# Patient Record
Sex: Female | Born: 1995 | Race: Black or African American | Hispanic: No | Marital: Single | Smoking: Never smoker
Health system: Southern US, Community
[De-identification: ages and names within clinical notes are randomized; demographics above are authoritative.]

## PROBLEM LIST (undated history)

## (undated) ENCOUNTER — Ambulatory Visit: Admission: EM | Source: Home / Self Care

## (undated) ENCOUNTER — Inpatient Hospital Stay (HOSPITAL_COMMUNITY): Payer: Self-pay

## (undated) DIAGNOSIS — D649 Anemia, unspecified: Secondary | ICD-10-CM

## (undated) DIAGNOSIS — R011 Cardiac murmur, unspecified: Secondary | ICD-10-CM

## (undated) HISTORY — PX: NO PAST SURGERIES: SHX2092

---

## 2018-05-21 ENCOUNTER — Ambulatory Visit: Payer: Self-pay | Admitting: Nurse Practitioner

## 2018-05-21 ENCOUNTER — Encounter: Payer: Self-pay | Admitting: Nurse Practitioner

## 2018-05-21 VITALS — BP 112/60 | HR 74 | Temp 98.9°F | Wt 135.8 lb

## 2018-05-21 DIAGNOSIS — H6691 Otitis media, unspecified, right ear: Secondary | ICD-10-CM

## 2018-05-21 MED ORDER — AMOXICILLIN 875 MG PO TABS: 875.0000 mg | ORAL_TABLET | Freq: Two times a day (BID) | ORAL | 0 refills | Status: AC

## 2018-05-21 NOTE — Patient Instructions (Signed)
Otitis Media, Adult Continue use of Tylenol as needed for pain. -Warm compresses to the right ear for comfort. -Avoid getting water in the right ear. -Follow up with ER or ENT if loss of hearing, change in hearing or symptoms do not improve.   Otitis media occurs when there is inflammation and fluid in the middle ear. Your middle ear is a part of the ear that contains bones for hearing as well as air that helps send sounds to your brain. What are the causes? This condition is caused by a blockage in the eustachian tube. This tube drains fluid from the ear to the back of the nose (nasopharynx). A blockage in this tube can be caused by an object or by swelling (edema) in the tube. Problems that can cause a blockage include:  A cold or other upper respiratory infection.  Allergies.  An irritant, such as tobacco smoke.  Enlarged adenoids. The adenoids are areas of soft tissue located high in the back of the throat, behind the nose and the roof of the mouth.  A mass in the nasopharynx.  Damage to the ear caused by pressure changes (barotrauma).  What are the signs or symptoms? Symptoms of this condition include:  Ear pain.  A fever.  Decreased hearing.  A headache.  Tiredness (lethargy).  Fluid leaking from the ear.  Ringing in the ear.  How is this diagnosed? This condition is diagnosed with a physical exam. During the exam your health care provider will use an instrument called an otoscope to look into your ear and check for redness, swelling, and fluid. He or she will also ask about your symptoms. Your health care provider may also order tests, such as:  A test to check the movement of the eardrum (pneumatic otoscopy). This test is done by squeezing a small amount of air into the ear.  A test that changes air pressure in the middle ear to check how well the eardrum moves and whether the eustachian tube is working (tympanogram).  How is this treated? This condition  usually goes away on its own within 3-5 days. But if the condition is caused by a bacteria infection and does not go away own its own, or keeps coming back, your health care provider may:  Prescribe antibiotic medicines to treat the infection.  Prescribe or recommend medicines to control pain.  Follow these instructions at home:  Take over-the-counter and prescription medicines only as told by your health care provider.  If you were prescribed an antibiotic medicine, take it as told by your health care provider. Do not stop taking the antibiotic even if you start to feel better.  Keep all follow-up visits as told by your health care provider. This is important. Contact a health care provider if:  You have bleeding from your nose.  There is a lump on your neck.  You are not getting better in 5 days.  You feel worse instead of better. Get help right away if:  You have severe pain that is not controlled with medicine.  You have swelling, redness, or pain around your ear.  You have stiffness in your neck.  A part of your face is paralyzed.  The bone behind your ear (mastoid) is tender when you touch it.  You develop a severe headache. Summary  Otitis media is redness, soreness, and swelling of the middle ear.  This condition usually goes away on its own within 3-5 days.  If the problem does not go  away in 3-5 days, your health care provider may prescribe or recommend medicines to treat your symptoms.  If you were prescribed an antibiotic medicine, take it as told by your health care provider. This information is not intended to replace advice given to you by your health care provider. Make sure you discuss any questions you have with your health care provider. Document Released: 07/24/2004 Document Revised: 10/09/2016 Document Reviewed: 10/09/2016 Elsevier Interactive Patient Education  [employer].

## 2018-05-21 NOTE — Progress Notes (Signed)
Subjective:    Patient ID: Lindsay Simmons, female    DOB: 1996/10/27, 22 y.o.   MRN: [id number]  Patient is a 22 year old female who presents today for complaints of right ear pain.  The patient states her pain started about 3 days ago after she used some ear cleaner to clean her ears.  The patient states while she was doing this she use regular water from the faucet instead of distilled water.  The patient states the next morning she woke up with ear pain.  Patient also states that she sometimes feels like her ears have had to "pop ".  The patient states that she does have a history of cerumen impaction, but feels that this is different today.  The patient denies loss of hearing, change in hearing, coughing, runny nose, congestion, sore throat, but states that she did have some chills and possibly a fever.  The patient states that she has taken Tylenol which has helped her ear pain.  The patient denies any history of seasonal allergies.  Otalgia   There is pain in the right ear. The current episode started in the past 7 days. The problem occurs every few hours. The problem has been unchanged. Maximum temperature: suspected but not measured. The fever has been present for 1 to 2 days. The pain is at a severity of 4/10. The pain is mild. Pertinent negatives include no coughing, ear discharge, headaches, hearing loss, rhinorrhea or sore throat. She has tried acetaminophen for the symptoms. The treatment provided mild relief. cerumen impaction    Review of Systems  Constitutional: Positive for activity change, chills, fatigue and fever. Negative for appetite change.  HENT: Positive for ear pain. Negative for congestion, ear discharge, hearing loss, postnasal drip, rhinorrhea, sinus pressure, sinus pain, sneezing, sore throat and tinnitus.   Eyes: Negative.   Respiratory: Negative.  Negative for cough.   Cardiovascular: Negative.   Skin: Negative.   Allergic/Immunologic: Positive for environmental  allergies.  Neurological: Negative for dizziness and headaches.       Objective:   Physical Exam  Constitutional: She appears well-developed and well-nourished. No distress.  HENT:  Head: Normocephalic and atraumatic.  Right TM erythematous and bulging, ear canal is erythematous, tragus and pinna are tender to manipulation. Left TM is opaque, able to visualize landmarks, canal without erythema.  Eyes: Pupils are equal, round, and reactive to light. Conjunctivae and EOM are normal.  Neck: Normal range of motion. Neck supple. No tracheal deviation present. No thyromegaly present.  Cardiovascular: Normal rate, regular rhythm and normal heart sounds.  Pulmonary/Chest: Effort normal and breath sounds normal.  Skin: Skin is warm and dry. Capillary refill takes 2 to 3 seconds.  Psychiatric: She has a normal mood and affect.       Assessment & Plan:  Exam findings, diagnosis etiology and medication use and indications reviewed with patient. Follow- Up and discharge instructions provided. No emergent/urgent issues found on exam.  Patient verbalized understanding of information provided and agrees with plan of care (POC), all questions answered.  1. Right otitis media, unspecified otitis media type  - amoxicillin (AMOXIL) 875 MG tablet; Take 1 tablet (875 mg total) by mouth 2 (two) times daily for 10 days.  Dispense: 20 tablet; Refill: 0 -Continue use of Tylenol as needed for pain. -Warm compresses to the right ear for comfort. -Avoid getting water in the right ear. -Follow up with ER or ENT if loss of hearing, change in hearing or symptoms do not improve.

## 2018-10-15 ENCOUNTER — Inpatient Hospital Stay (HOSPITAL_COMMUNITY)
Admission: AD | Admit: 2018-10-15 | Discharge: 2018-10-15 | Disposition: A | Discharge status: Home / Self Care | Payer: Medicaid Other | Source: Ambulatory Visit | Attending: Family Medicine | Admitting: Family Medicine

## 2018-10-15 ENCOUNTER — Other Ambulatory Visit: Payer: Self-pay

## 2018-10-15 ENCOUNTER — Encounter (HOSPITAL_COMMUNITY): Payer: Self-pay | Admitting: *Deleted

## 2018-10-15 DIAGNOSIS — Z3A12 12 weeks gestation of pregnancy: Secondary | ICD-10-CM | POA: Diagnosis not present

## 2018-10-15 DIAGNOSIS — Z3491 Encounter for supervision of normal pregnancy, unspecified, first trimester: Secondary | ICD-10-CM

## 2018-10-15 DIAGNOSIS — N939 Abnormal uterine and vaginal bleeding, unspecified: Secondary | ICD-10-CM | POA: Diagnosis not present

## 2018-10-15 DIAGNOSIS — O26891 Other specified pregnancy related conditions, first trimester: Secondary | ICD-10-CM | POA: Diagnosis not present

## 2018-10-15 DIAGNOSIS — O209 Hemorrhage in early pregnancy, unspecified: Secondary | ICD-10-CM

## 2018-10-15 DIAGNOSIS — N930 Postcoital and contact bleeding: Secondary | ICD-10-CM | POA: Insufficient documentation

## 2018-10-15 HISTORY — DX: Anemia, unspecified: D64.9

## 2018-10-15 LAB — URINALYSIS, MICROSCOPIC (REFLEX)

## 2018-10-15 LAB — CBC WITH DIFFERENTIAL/PLATELET
Basophils Absolute: 0 10*3/uL (ref 0.0–0.1)
Basophils Relative: 0 %
Eosinophils Absolute: 0 10*3/uL (ref 0.0–0.5)
Eosinophils Relative: 0 %
HEMATOCRIT: 32.8 % — AB (ref 36.0–46.0)
Hemoglobin: 11.1 g/dL — ABNORMAL LOW (ref 12.0–15.0)
Lymphocytes Relative: 28 %
Lymphs Abs: 3 10*3/uL (ref 0.7–4.0)
MCH: 25.5 pg — ABNORMAL LOW (ref 26.0–34.0)
MCHC: 33.8 g/dL (ref 30.0–36.0)
MCV: 75.2 fL — ABNORMAL LOW (ref 80.0–100.0)
MONO ABS: 0.4 10*3/uL (ref 0.1–1.0)
Monocytes Relative: 4 %
NEUTROS ABS: 7.2 10*3/uL (ref 1.7–7.7)
Neutrophils Relative %: 68 %
PLATELETS: 190 10*3/uL (ref 150–400)
RBC: 4.36 MIL/uL (ref 3.87–5.11)
RDW: 13.6 % (ref 11.5–15.5)
WBC: 10.6 10*3/uL — AB (ref 4.0–10.5)
nRBC: 0 % (ref 0.0–0.2)

## 2018-10-15 LAB — URINALYSIS, ROUTINE W REFLEX MICROSCOPIC
Bilirubin Urine: NEGATIVE
Glucose, UA: NEGATIVE mg/dL
Ketones, ur: NEGATIVE mg/dL
LEUKOCYTES UA: NEGATIVE
Nitrite: NEGATIVE
Protein, ur: NEGATIVE mg/dL
Specific Gravity, Urine: 1.015 (ref 1.005–1.030)
pH: 6 (ref 5.0–8.0)

## 2018-10-15 LAB — HCG, QUANTITATIVE, PREGNANCY: hCG, Beta Chain, Quant, S: 88783 m[IU]/mL — ABNORMAL HIGH (ref <5)

## 2018-10-15 LAB — WET PREP, GENITAL
Clue Cells Wet Prep HPF POC: NONE SEEN
Sperm: NONE SEEN
Trich, Wet Prep: NONE SEEN
Yeast Wet Prep HPF POC: NONE SEEN

## 2018-10-15 NOTE — Discharge Instructions (Signed)
Second Trimester of Pregnancy The second trimester is from week 13 through week 28, month 4 through 6. This is often the time in pregnancy that you feel your best. Often times, morning sickness has lessened or quit. You may have more energy, and you may get hungry more often. Your unborn baby (fetus) is growing rapidly. At the end of the sixth month, he or she is about 9 inches long and weighs about 1 pounds. You will likely feel the baby move (quickening) between 18 and 20 weeks of pregnancy. Follow these instructions at home:  Avoid all smoking, herbs, and alcohol. Avoid drugs not approved by your doctor.  Do not use any tobacco products, including cigarettes, chewing tobacco, and electronic cigarettes. If you need help quitting, ask your doctor. You may get counseling or other support to help you quit.  Only take medicine as told by your doctor. Some medicines are safe and some are not during pregnancy.  Exercise only as told by your doctor. Stop exercising if you start having cramps.  Eat regular, healthy meals.  Wear a good support bra if your breasts are tender.  Do not use hot tubs, steam rooms, or saunas.  Wear your seat belt when driving.  Avoid raw meat, uncooked cheese, and liter boxes and soil used by cats.  Take your prenatal vitamins.  Take 1500-2000 milligrams of calcium daily starting at the 20th week of pregnancy until you deliver your baby.  Try taking medicine that helps you poop (stool softener) as needed, and if your doctor approves. Eat more fiber by eating fresh fruit, vegetables, and whole grains. Drink enough fluids to keep your pee (urine) clear or pale yellow.  Take warm water baths (sitz baths) to soothe pain or discomfort caused by hemorrhoids. Use hemorrhoid cream if your doctor approves.  If you have puffy, bulging veins (varicose veins), wear support hose. Raise (elevate) your feet for 15 minutes, 3-4 times a day. Limit salt in your diet.  Avoid heavy  lifting, wear low heals, and sit up straight.  Rest with your legs raised if you have leg cramps or low back pain.  Visit your dentist if you have not gone during your pregnancy. Use a soft toothbrush to brush your teeth. Be gentle when you floss.  You can have sex (intercourse) unless your doctor tells you not to.  Go to your doctor visits. Get help if:  You feel dizzy.  You have mild cramps or pressure in your lower belly (abdomen).  You have a nagging pain in your belly area.  You continue to feel sick to your stomach (nauseous), throw up (vomit), or have watery poop (diarrhea).  You have bad smelling fluid coming from your vagina.  You have pain with peeing (urination). Get help right away if:  You have a fever.  You are leaking fluid from your vagina.  You have spotting or bleeding from your vagina.  You have severe belly cramping or pain.  You lose or gain weight rapidly.  You have trouble catching your breath and have chest pain.  You notice sudden or extreme puffiness (swelling) of your face, hands, ankles, feet, or legs.  You have not felt the baby move in over an hour.  You have severe headaches that do not go away with medicine.  You have vision changes. This information is not intended to replace advice given to you by your health care provider. Make sure you discuss any questions you have with your health care   provider. Document Released: 01/13/2010 Document Revised: 03/26/2016 Document Reviewed: 12/20/2012 Elsevier Interactive Patient Education  2017 [employer] Inc.  

## 2018-10-15 NOTE — MAU Provider Note (Signed)
History     CSN: [id number]  Arrival date and time: 10/15/18 2025   First Provider Initiated Contact with Patient 10/15/18 2102      Chief Complaint  Patient presents with  . Vaginal Bleeding   HPI Lindsay Simmons is a 22 y.o. G1P0 at [redacted]w[redacted]d who presents to MAU with chief complaint of vaginal bleeding, new onset after intercourse last night about 9pm. She endorses a "gush of blood" which lasted about ten minutes, then no bleeding until a brief episode of spotting at 7pm tonight. She denies abdominal cramping, SOB, abnormal vaginal discharge, fever or recent illness.  [facility] at [redacted] weeks gestation.   OB History    Gravida  1   Para      Term      Preterm      AB      Living        SAB      TAB      Ectopic      Multiple      Live Births              Past Medical History:  Diagnosis Date  . Anemia     History reviewed. No pertinent surgical history.  History reviewed. No pertinent family history.  Social History   Tobacco Use  . Smoking status: Never Smoker  . Smokeless tobacco: Never Used  Substance Use Topics  . Alcohol use: Never    Frequency: Never  . Drug use: Never    Allergies: No Known Allergies  No medications prior to admission.    Review of Systems  Constitutional: Negative for chills, fatigue and fever.  Gastrointestinal: Negative for abdominal pain, nausea and vomiting.  Genitourinary: Positive for vaginal bleeding. Negative for difficulty urinating and flank pain.  Musculoskeletal: Negative for back pain.  All other systems reviewed and are negative.  Physical Exam   Blood pressure (!) 128/50, pulse 81, temperature 98 F (36.7 C), resp. rate 18, height 5\' 4"  (1.626 m), weight 63.5 kg, last menstrual period 07/21/2018.  Physical Exam  Nursing note and vitals reviewed. Constitutional: She is oriented to person, place, and time. She appears well-developed and well-nourished.   Cardiovascular: Normal rate.  Respiratory: Effort normal. No respiratory distress.  GI: Soft. She exhibits no distension. There is no abdominal tenderness. There is no rebound and no guarding.  Genitourinary:    Vaginal discharge present.     Genitourinary Comments: Dark brown vaginal discharge, removed with fox swab x 2 for improved visibility and specimen collection. No new bleeding observed   Neurological: She is alert and oriented to person, place, and time.  Skin: Skin is warm and dry.  Psychiatric: She has a normal mood and affect. Her behavior is normal. Judgment and thought content normal.    MAU Course/MDM    --FHT 150 via bedside ultrasound. Patient informed that the ultrasound is considered a limited obstetric ultrasound and is not intended to be a complete ultrasound exam.  Patient also informed that the ultrasound is not being completed with the intent of assessing for fetal or placental anomalies or any pelvic abnormalities.  Explained that the purpose of today's ultrasound is to assess for fetal heart rate.  Patient acknowledges the purpose of the exam and the limitations of the study.    Patient Vitals for the past 24 hrs:  BP Temp Pulse Resp SpO2 Height Weight  10/15/18 2226 (!) 113/48 - 82 16 100 % - -  10/15/18 2036 (!) 128/50 98 F (36.7 C) 81 18 - 5\' 4"  (1.626 m) 63.5 kg    Results for orders placed or performed during the hospital encounter of 10/15/18 (from the past 24 hour(s))  Urinalysis, Routine w reflex microscopic     Status: Abnormal   Collection Time: 10/15/18  9:00 PM  Result Value Ref Range   Color, Urine YELLOW YELLOW   APPearance CLEAR CLEAR   Specific Gravity, Urine 1.015 1.005 - 1.030   pH 6.0 5.0 - 8.0   Glucose, UA NEGATIVE NEGATIVE mg/dL   Hgb urine dipstick LARGE (A) NEGATIVE   Bilirubin Urine NEGATIVE NEGATIVE   Ketones, ur NEGATIVE NEGATIVE mg/dL   Protein, ur NEGATIVE NEGATIVE mg/dL   Nitrite NEGATIVE NEGATIVE   Leukocytes, UA NEGATIVE  NEGATIVE  Urinalysis, Microscopic (reflex)     Status: Abnormal   Collection Time: 10/15/18  9:00 PM  Result Value Ref Range   RBC / HPF 11-20 0 - 5 RBC/hpf   WBC, UA 0-5 0 - 5 WBC/hpf   Bacteria, UA RARE (A) NONE SEEN   Squamous Epithelial / LPF 6-10 0 - 5   Mucus PRESENT   Wet prep, genital     Status: Abnormal   Collection Time: 10/15/18  9:18 PM  Result Value Ref Range   Yeast Wet Prep HPF POC NONE SEEN NONE SEEN   Trich, Wet Prep NONE SEEN NONE SEEN   Clue Cells Wet Prep HPF POC NONE SEEN NONE SEEN   WBC, Wet Prep HPF POC FEW (A) NONE SEEN   Sperm NONE SEEN   CBC with Differential/Platelet     Status: Abnormal   Collection Time: 10/15/18  9:23 PM  Result Value Ref Range   WBC 10.6 (H) 4.0 - 10.5 K/uL   RBC 4.36 3.87 - 5.11 MIL/uL   Hemoglobin 11.1 (L) 12.0 - 15.0 g/dL   HCT 16.132.8 (L) 09.636.0 - 04.546.0 %   MCV 75.2 (L) 80.0 - 100.0 fL   MCH 25.5 (L) 26.0 - 34.0 pg   MCHC 33.8 30.0 - 36.0 g/dL   RDW 40.913.6 81.111.5 - 91.415.5 %   Platelets 190 150 - 400 K/uL   nRBC 0.0 0.0 - 0.2 %   Neutrophils Relative % 68 %   Neutro Abs 7.2 1.7 - 7.7 K/uL   Lymphocytes Relative 28 %   Lymphs Abs 3.0 0.7 - 4.0 K/uL   Monocytes Relative 4 %   Monocytes Absolute 0.4 0.1 - 1.0 K/uL   Eosinophils Relative 0 %   Eosinophils Absolute 0.0 0.0 - 0.5 K/uL   Basophils Relative 0 %   Basophils Absolute 0.0 0.0 - 0.1 K/uL  hCG, quantitative, pregnancy     Status: Abnormal   Collection Time: 10/15/18  9:23 PM  Result Value Ref Range   hCG, Beta Chain, Quant, S [id number] (H) <5 mIU/mL  ABO/Rh     Status: None (Preliminary result)   Collection Time: 10/15/18  9:23 PM  Result Value Ref Range   ABO/RH(D)      O POS Performed at [facility], [address] [place], [place] [address]     Assessment and Plan  --22 y.o. G1P0 at 3440w2d  --FHT 150 --Postcoital bleeding --Blood Type O POS, Rhogam not indicated --Discharge home in stable condition  F/U: [facility] 10/19/18 and  11/03/2018  Calvert CantorSamantha C Weinhold, CNM 10/15/2018, 10:44 PM

## 2018-10-15 NOTE — MAU Note (Signed)
Last night had vaginal bleeding after intercourse. Blood "was gushing out". Tonight had spotting that is bright red. No pain.

## 2018-10-16 LAB — ABO/RH: ABO/RH(D): O POS

## 2018-10-17 LAB — GC/CHLAMYDIA PROBE AMP (~~LOC~~) NOT AT ARMC
Chlamydia: NEGATIVE
Neisseria Gonorrhea: NEGATIVE

## 2018-10-19 ENCOUNTER — Encounter: Payer: Self-pay | Admitting: General Practice

## 2018-10-19 ENCOUNTER — Other Ambulatory Visit: Payer: Self-pay

## 2018-10-19 ENCOUNTER — Ambulatory Visit: Payer: Medicaid Other | Admitting: *Deleted

## 2018-10-19 VITALS — BP 110/57 | HR 75 | Temp 98.3°F | Wt 139.0 lb

## 2018-10-19 DIAGNOSIS — Z3400 Encounter for supervision of normal first pregnancy, unspecified trimester: Secondary | ICD-10-CM | POA: Diagnosis not present

## 2018-10-19 LAB — POCT URINALYSIS DIPSTICK OB
Bilirubin, UA: NEGATIVE
Glucose, UA: NEGATIVE
Ketones, UA: NEGATIVE
LEUKOCYTES UA: NEGATIVE
NITRITE UA: NEGATIVE
POC,PROTEIN,UA: NEGATIVE
SPEC GRAV UA: 1.015 (ref 1.010–1.025)
Urobilinogen, UA: 0.2 E.U./dL
pH, UA: 7 (ref 5.0–8.0)

## 2018-10-19 MED ORDER — ONE-A-DAY WOMENS PRENATAL 1 28-0.8-235 MG PO CAPS: 1.0000 | ORAL_CAPSULE | Freq: Every day | ORAL | 12 refills | Status: DC

## 2018-10-19 NOTE — Progress Notes (Addendum)
   PRENATAL INTAKE SUMMARY  [name] presents today New OB Nurse Interview.  OB History    Gravida  1   Para      Term      Preterm      AB      Living        SAB      TAB      Ectopic      Multiple      Live Births             I have reviewed the patient's medical, obstetrical, social, and family histories, medications, and available lab results.  SUBJECTIVE She has no unusual complaints.  OBJECTIVE Initial nurse interview for history and lab work (New OB).  EDD: 04/27/2019 by LMP GA: 3172w0d G1P0  GENERAL APPEARANCE: alert, well appearing, in no apparent distress, oriented to person, place and time, well hydrated.   ASSESSMENT Normal pregnancy.  PLAN Prenatal care-[facility]. Patient is thinking of contacting a pregnancy care center. OB Pnl/HIV  OB Urine Culture/dip GC/CT completed 10/15/18; results negative PAP at next visit with CNM HgbEval SMA/CF ([facility]) [facility] PNV sent to pharmacy  Genetic Screening Results Information: You are having genetic testing called [facility] today.  It will take approximately 2 weeks before the results are available.  To get your results, you need Internet access to a web browser to search [facility]/MyChart (the direct app on your phone will not give you these results).  Then select Lab Scanned and click on the blue hyper link that says View Image to see your [facility] results.  You can also use the directions on the purple card given to look up your results directly on the [facility] website.  Clovis PuMartin, Cyndie Woodbeck L, RN

## 2018-10-20 ENCOUNTER — Encounter: Payer: Self-pay | Admitting: General Practice

## 2018-10-20 LAB — OBSTETRIC PANEL, INCLUDING HIV
Antibody Screen: NEGATIVE
Basophils Absolute: 0 10*3/uL (ref 0.0–0.2)
Basos: 0 %
EOS (ABSOLUTE): 0.1 10*3/uL (ref 0.0–0.4)
Eos: 1 %
HIV Screen 4th Generation wRfx: NONREACTIVE
Hematocrit: 32.5 % — ABNORMAL LOW (ref 34.0–46.6)
Hemoglobin: 10.7 g/dL — ABNORMAL LOW (ref 11.1–15.9)
Hepatitis B Surface Ag: NEGATIVE
IMMATURE GRANULOCYTES: 0 %
Immature Grans (Abs): 0 10*3/uL (ref 0.0–0.1)
LYMPHS: 29 %
Lymphocytes Absolute: 2.6 10*3/uL (ref 0.7–3.1)
MCH: 25.4 pg — ABNORMAL LOW (ref 26.6–33.0)
MCHC: 32.9 g/dL (ref 31.5–35.7)
MCV: 77 fL — ABNORMAL LOW (ref 79–97)
MONOCYTES: 7 %
Monocytes Absolute: 0.6 10*3/uL (ref 0.1–0.9)
Neutrophils Absolute: 5.7 10*3/uL (ref 1.4–7.0)
Neutrophils: 63 %
Platelets: 211 10*3/uL (ref 150–450)
RBC: 4.22 x10E6/uL (ref 3.77–5.28)
RDW: 14.4 % (ref 12.3–15.4)
RPR Ser Ql: NONREACTIVE
Rh Factor: POSITIVE
Rubella Antibodies, IGG: 1.48 index (ref >0.99)
WBC: 9 10*3/uL (ref 3.4–10.8)

## 2018-10-20 LAB — SICKLE CELL SCREEN: Sickle Cell Screen: NEGATIVE

## 2018-10-23 LAB — URINE CULTURE, OB REFLEX

## 2018-10-23 LAB — CULTURE, OB URINE

## 2018-10-27 ENCOUNTER — Ambulatory Visit (HOSPITAL_COMMUNITY): Payer: Medicaid Other

## 2018-10-28 ENCOUNTER — Inpatient Hospital Stay (HOSPITAL_COMMUNITY)
Admission: AD | Admit: 2018-10-28 | Discharge: 2018-10-28 | Disposition: A | Discharge status: Home / Self Care | Payer: BLUE CROSS/BLUE SHIELD | Attending: Obstetrics & Gynecology | Admitting: Obstetrics & Gynecology

## 2018-10-28 ENCOUNTER — Encounter (HOSPITAL_COMMUNITY): Payer: Self-pay

## 2018-10-28 DIAGNOSIS — O26852 Spotting complicating pregnancy, second trimester: Secondary | ICD-10-CM | POA: Insufficient documentation

## 2018-10-28 DIAGNOSIS — Z3A14 14 weeks gestation of pregnancy: Secondary | ICD-10-CM | POA: Insufficient documentation

## 2018-10-28 DIAGNOSIS — N930 Postcoital and contact bleeding: Secondary | ICD-10-CM | POA: Diagnosis not present

## 2018-10-28 DIAGNOSIS — Z3400 Encounter for supervision of normal first pregnancy, unspecified trimester: Secondary | ICD-10-CM

## 2018-10-28 DIAGNOSIS — O26892 Other specified pregnancy related conditions, second trimester: Secondary | ICD-10-CM | POA: Diagnosis not present

## 2018-10-28 DIAGNOSIS — Z3492 Encounter for supervision of normal pregnancy, unspecified, second trimester: Secondary | ICD-10-CM

## 2018-10-28 LAB — WET PREP, GENITAL
Clue Cells Wet Prep HPF POC: NONE SEEN
SPERM: NONE SEEN
Trich, Wet Prep: NONE SEEN
Yeast Wet Prep HPF POC: NONE SEEN

## 2018-10-28 LAB — URINALYSIS, ROUTINE W REFLEX MICROSCOPIC
Bacteria, UA: NONE SEEN
Bilirubin Urine: NEGATIVE
Glucose, UA: NEGATIVE mg/dL
Ketones, ur: NEGATIVE mg/dL
Leukocytes, UA: NEGATIVE
Nitrite: NEGATIVE
Protein, ur: NEGATIVE mg/dL
Specific Gravity, Urine: 1.02 (ref 1.005–1.030)
pH: 6 (ref 5.0–8.0)

## 2018-10-28 NOTE — MAU Provider Note (Signed)
History     CSN: [id number]  Arrival date and time: 10/28/18 1026   First Provider Initiated Contact with Patient 10/28/18 1053      Chief Complaint  Patient presents with  . Vaginal Discharge   HPI Lindsay Simmons is a 22 y.o. G1P0 at 3638w1d who presents to MAU with chief complaint of vaginal spotting. This is a recurring problem. Current episode started about five days ago after penetrative sex. Patient endorses slow trickle of dark brown vaginal discharge. She denies abdominal cramping, dysuria, heavy vaginal bleeding, and abnormal vaginal discharge.  OB History    Gravida  1   Para      Term      Preterm      AB      Living        SAB      TAB      Ectopic      Multiple      Live Births              Past Medical History:  Diagnosis Date  . Anemia     Past Surgical History:  Procedure Laterality Date  . NO PAST SURGERIES      History reviewed. No pertinent family history.  Social History   Tobacco Use  . Smoking status: Never Smoker  . Smokeless tobacco: Never Used  Substance Use Topics  . Alcohol use: Never    Frequency: Never  . Drug use: Never    Allergies: No Known Allergies  Medications Prior to Admission  Medication Sig Dispense Refill Last Dose  . Prenat-Fe Carbonyl-FA-Omega 3 (ONE-A-DAY WOMENS PRENATAL 1) 28-0.8-235 MG CAPS Take 1 capsule by mouth daily. 30 capsule 12     Review of Systems  Constitutional: Negative for chills, fatigue and fever.  Gastrointestinal: Negative for abdominal pain.  Genitourinary: Positive for vaginal bleeding. Negative for vaginal discharge and vaginal pain.  Musculoskeletal: Negative for back pain.  Neurological: Negative for dizziness and headaches.  All other systems reviewed and are negative.  Physical Exam   Blood pressure (!) 104/51, pulse (!) 57, temperature 98.4 F (36.9 C), temperature source Oral, resp. rate 15, height 5\' 4"  (1.626 m), weight 63.5 kg, last menstrual period 07/21/2018,  SpO2 100 %.  Physical Exam  Nursing note and vitals reviewed. Constitutional: She is oriented to person, place, and time. She appears well-developed and well-nourished.  Cardiovascular: Normal rate.  Respiratory: Effort normal.  GI: She exhibits no distension. There is no abdominal tenderness. There is no rebound and no guarding.  Genitourinary:    Vaginal discharge present.     Genitourinary Comments: Scant amount of dark brown discharge noted at cervical os. Removed with tip of fox swab x 1. No new bleeding observed   Neurological: She is alert and oriented to person, place, and time.  Skin: Skin is warm and dry.  Psychiatric: She has a normal mood and affect. Her behavior is normal. Judgment and thought content normal.    MAU Course/MDM   Patient Vitals for the past 24 hrs:  BP Temp Temp src Pulse Resp SpO2 Height Weight  10/28/18 1116 (!) 99/49 - - 65 - - - -  10/28/18 1038 (!) 104/51 98.4 F (36.9 C) Oral (!) 57 15 100 % 5\' 4"  (1.626 m) 63.5 kg    Results for orders placed or performed during the hospital encounter of 10/28/18 (from the past 24 hour(s))  Urinalysis, Routine w reflex microscopic     Status: Abnormal  Collection Time: 10/28/18 10:45 AM  Result Value Ref Range   Color, Urine YELLOW YELLOW   APPearance CLEAR CLEAR   Specific Gravity, Urine 1.020 1.005 - 1.030   pH 6.0 5.0 - 8.0   Glucose, UA NEGATIVE NEGATIVE mg/dL   Hgb urine dipstick MODERATE (A) NEGATIVE   Bilirubin Urine NEGATIVE NEGATIVE   Ketones, ur NEGATIVE NEGATIVE mg/dL   Protein, ur NEGATIVE NEGATIVE mg/dL   Nitrite NEGATIVE NEGATIVE   Leukocytes, UA NEGATIVE NEGATIVE   RBC / HPF 0-5 0 - 5 RBC/hpf   WBC, UA 0-5 0 - 5 WBC/hpf   Bacteria, UA NONE SEEN NONE SEEN   Squamous Epithelial / LPF 0-5 0 - 5   Mucus PRESENT   Wet prep, genital     Status: Abnormal   Collection Time: 10/28/18 11:04 AM  Result Value Ref Range   Yeast Wet Prep HPF POC NONE SEEN NONE SEEN   Trich, Wet Prep NONE SEEN NONE  SEEN   Clue Cells Wet Prep HPF POC NONE SEEN NONE SEEN   WBC, Wet Prep HPF POC FEW (A) NONE SEEN   Sperm NONE SEEN      Assessment and Plan  --22 y.o. G1P0 at 10579w1d  --FHT 156 by Doppler --Postcoital spotting, no active bleeding --Advised pelvic rest --Discharge home in stable condition  [facility] 11/03/2018  Calvert CantorSamantha C C[place]ah Jr, CNM 10/28/2018, 11:32 AM

## 2018-10-28 NOTE — MAU Note (Signed)
Pt reports brownish discharge for the last week,

## 2018-10-28 NOTE — Discharge Instructions (Signed)
Vaginal Bleeding During Pregnancy, First Trimester    A small amount of bleeding (spotting) from the vagina is common during early pregnancy. Sometimes the bleeding is normal and does not cause problems. At other times, though, bleeding may be a sign of something serious. Tell your doctor about any bleeding from your vagina right away.  Follow these instructions at home:  Activity  · Follow your doctor's instructions about how active you can be.  · If needed, make plans for someone to help with your normal activities.  · Do not have sex or orgasms until your doctor says that this is safe.  General instructions  · Take over-the-counter and prescription medicines only as told by your doctor.  · Watch your condition for any changes.  · Write down:  ? The number of pads you use each day.  ? How often you change pads.  ? How soaked (saturated) your pads are.  · Do not use tampons.  · Do not douche.  · If you pass any tissue from your vagina, save it to show to your doctor.  · Keep all follow-up visits as told by your doctor. This is important.  Contact a doctor if:  · You have vaginal bleeding at any time while you are pregnant.  · You have cramps.  · You have a fever.  Get help right away if:  · You have very bad cramps in your back or belly (abdomen).  · You pass large clots or a lot of tissue from your vagina.  · Your bleeding gets worse.  · You feel light-headed.  · You feel weak.  · You pass out (faint).  · You have chills.  · You are leaking fluid from your vagina.  · You have a gush of fluid from your vagina.  Summary  · Sometimes vaginal bleeding during pregnancy is normal and does not cause problems. At other times, bleeding may be a sign of something serious.  · Tell your doctor about any bleeding from your vagina right away.  · Follow your doctor's instructions about how active you can be. You may need someone to help you with your normal activities.  This information is not intended to replace advice given to  you by your health care provider. Make sure you discuss any questions you have with your health care provider.  Document Released: 03/05/2014 Document Revised: 01/20/2017 Document Reviewed: 01/20/2017  Elsevier Interactive Patient Education © 2019 Elsevier Inc.

## 2018-10-31 ENCOUNTER — Encounter: Payer: Self-pay | Admitting: General Practice

## 2018-11-02 NOTE — L&D Delivery Note (Signed)
Delivery Note  First Stage: labor mode: spontaneous - prolonged  prodromal ctx x 48 hours and protracted labor > 24 hours labor (contractions) onset: 6/20 0700 AROM at 2225 analgesia /anesthesia intrapartum: IV Nubain x 1 and epidural  Second Stage: complete dilation at 6/21 at 1555 with long caput persistent OP lie onset of pushing at 1600 FHR second stage category 2 with variable decels and new intermittent audible FHR arrythmia  delivery of a viable female at 72 by CNM with 180 degree rotation with crowning from ROP to direct OA no nuchal cord  cord double clamped after cessation of pulsation, cut by FOB cord blood sample collected   Third Stage: placenta delivered Carolanne Grumbling - appears intact with 3 VC @ 1838  no lacerations identified   uterine tone firm fundus with steady trickle of blood Exam: LUS boggy with dilated cervix Speculum exam: no cervical LAC edges all intact  Cytotec 822mcg per rectum and bimanual compression of LUS Persistent trickle of blood followed by gush of blood with clots Exam: LUS remains boggy and strings of membranes identified - removal of several sections of retained membranes and small tissues with clots expressed from uterus - bleeding slowed  Again trickle started with gush of bleeding Dr Ronita Hipps in [place] & notified of retained tissue and episodic bleeding - to come after case finished Exam: LUS remains boggy with dilated cervix - removal of few strings of membranes and more clots. Methergine 0.2 IM given. Hemodynamically stable pulse and blood pressure.  CBC ordered. Mainline LR bolus 341ml.  Dr Ronita Hipps in to examine - epidural redose for pain control Small amount membrane strings and small piece of tissue removed fundus cleared and bleeding slowed TXA dose ordered. Single dose Ancef ordered for coverage uterine exploration  complications: PPH due to retained tissue/membranes estimated blood loss (mL): 1500  mom to postpartum / baby to Couplet  care / Skin to Skin.  Newborn: Apgar Scores: 1-minute: 9                           5-minute: 9   feeding plan: breastfeeding birth weight: 7 pounds 13.6 oz  Artelia Laroche CNM, MSN, [facility] 04/23/2019, 6:21 PM

## 2018-11-03 ENCOUNTER — Other Ambulatory Visit (HOSPITAL_COMMUNITY)
Admission: RE | Admit: 2018-11-03 | Discharge: 2018-11-03 | Disposition: A | Discharge status: Home / Self Care | Payer: BLUE CROSS/BLUE SHIELD | Source: Ambulatory Visit | Attending: Obstetrics and Gynecology | Admitting: Obstetrics and Gynecology

## 2018-11-03 ENCOUNTER — Ambulatory Visit (INDEPENDENT_AMBULATORY_CARE_PROVIDER_SITE_OTHER): Payer: Medicaid Other | Admitting: Obstetrics and Gynecology

## 2018-11-03 ENCOUNTER — Ambulatory Visit (HOSPITAL_COMMUNITY)
Admission: RE | Admit: 2018-11-03 | Discharge: 2018-11-03 | Disposition: A | Discharge status: Home / Self Care | Payer: Medicaid Other | Source: Ambulatory Visit | Attending: Obstetrics and Gynecology | Admitting: Obstetrics and Gynecology

## 2018-11-03 ENCOUNTER — Encounter (HOSPITAL_COMMUNITY): Payer: Self-pay

## 2018-11-03 VITALS — BP 102/45 | HR 73 | Wt 141.6 lb

## 2018-11-03 DIAGNOSIS — Z124 Encounter for screening for malignant neoplasm of cervix: Secondary | ICD-10-CM | POA: Insufficient documentation

## 2018-11-03 DIAGNOSIS — Z3400 Encounter for supervision of normal first pregnancy, unspecified trimester: Secondary | ICD-10-CM | POA: Insufficient documentation

## 2018-11-03 DIAGNOSIS — R011 Cardiac murmur, unspecified: Secondary | ICD-10-CM

## 2018-11-03 DIAGNOSIS — Z3402 Encounter for supervision of normal first pregnancy, second trimester: Secondary | ICD-10-CM

## 2018-11-03 NOTE — Patient Instructions (Signed)
Heart Murmur A heart murmur is an extra sound that is caused by chaotic blood flow through the valves of the heart. The murmur can be heard as a "hum" or "whoosh" sound when blood flows through the heart. There are two types of heart murmurs:  Innocent (benign) murmurs. Most people with this type of heart murmur do not have a heart problem. Many children have innocent heart murmurs. Your health care provider may suggest some basic tests to find out whether your murmur is an innocent murmur. If an innocent heart murmur is found, there is no need for further tests or treatment and no need to restrict activities or stop playing sports.  Abnormal murmurs. These types of murmurs can occur in children and adults. Abnormal murmurs may be a sign of a more serious heart condition, such as a heart defect present at birth (congenital defect) or heart valve disease. What are the causes?  The heart has four areas called chambers. Valves separate the upper and lower chambers from each other (tricuspid valve and mitral valve) and separate the lower chambers of the heart from pathways that lead away from the heart (aortic valve and pulmonary valve). Normally, the valves open to let blood flow through or out of your heart, and then they shut to keep the blood from flowing backward. This condition is caused by heart valves that are not working properly.  In children, abnormal heart murmurs are typically caused by congenital defects.  In adults, abnormal murmurs are usually caused by heart valve problems from disease, infection, or aging. This condition may also be caused by:  Pregnancy.  Fever.  Overactive thyroid gland.  Anemia.  Exercise.  Rapid growth spurts (in children). What are the signs or symptoms? Innocent murmurs do not cause symptoms, and many people with abnormal murmurs may not have symptoms. If symptoms do develop, they may include:  Shortness of breath.  Blue coloring of the skin,  especially on the fingertips.  Chest pain.  Palpitations, or feeling a fluttering or skipped heartbeat.  Fainting.  Persistent cough.  Getting tired much faster than expected.  Swelling in the abdomen, feet, or ankles. How is this diagnosed? This condition may be diagnosed during a routine physical or other exam. If your health care provider hears a murmur with a stethoscope, he or she will listen for:  Where the murmur is located in your heart.  How long the murmur lasts (duration).  When the murmur is heard during the heartbeat.  How loud the murmur is. This may help the health care provider figure out what is causing the murmur. You may be referred to a heart specialist (cardiologist). You may also have other tests, including:  Electrocardiogram (ECG or EKG). This test measures the electrical activity of your heart.  Echocardiogram. This test uses high frequency sound waves to make pictures of your heart.  MRI or chest X-ray.  Cardiac catheterization. This test looks at blood flow through the arteries around the heart. For children and adults who have an abnormal heart murmur and want to stay active, it is important to:  Complete testing.  Review test results.  Receive recommendations from your health care provider. If heart disease is present, it may not be safe to play or be active. How is this treated? Heart murmurs themselves do not need treatment. In some cases, a heart murmur may go away on its own. If an underlying problem or disease is causing the murmur, you may need treatment. If treatment  is needed, it will depend on the type and severity of the disease or heart problem causing the murmur. Treatment may include:  Medicine.  Surgery.  Dietary and lifestyle changes. Follow these instructions at home:  Talk with your health care provider before participating in sports or other activities that require a lot of effort and energy (are strenuous).  Learn as  much as possible about your condition and any related diseases. Ask your health care provider if you may be at risk for any medical emergencies.  Talk with your health care provider about what symptoms you should look out for.  It is up to you to get your test results. Ask your health care provider, or the department that is doing the test, when your results will be ready.  Keep all follow-up visits as told by your health care provider. This is important. Contact a health care provider if:  You are frequently short of breath.  You feel more tired than usual.  You are having a hard time keeping up with normal activities or fitness routines.  You have swelling in your ankles or feet.  You notice that your heart often beats irregularly.  You develop any new symptoms. Get help right away if:  You have chest pain.  You are having trouble breathing.  You feel light-headed or you pass out.  Your symptoms suddenly get worse. These symptoms may represent a serious problem that is an emergency. Do not wait to see if the symptoms will go away. Get medical help right away. Call your local emergency services (911 in the [place] Do not drive yourself to the hospital. Summary  Normally, the heart valves open to let blood flow through or out of your heart, and then they shut to keep the blood from flowing backward.  A heart murmur is caused by heart valves that are not working properly.  You may need treatment if an underlying problem or disease is causing the heart murmur. Treatment may include medicine, surgery, or dietary and lifestyle changes.  Talk with your health care provider before participating in sports or other activities that require a lot of effort and energy (are strenuous).  Talk with your health care provider about what symptoms you should watch out for. This information is not intended to replace advice given to you by your health care provider. Make sure you discuss any  questions you have with your health care provider. Document Released: 11/26/2004 Document Revised: 04/12/2018 Document Reviewed: 04/12/2018 Elsevier Interactive Patient Education  2019 [employer] Second Trimester of Pregnancy  The second trimester is from week 14 through week 27 (month 4 through 6). This is often the time in pregnancy that you feel your best. Often times, morning sickness has lessened or quit. You may have more energy, and you may get hungry more often. Your unborn baby is growing rapidly. At the end of the sixth month, he or she is about 9 inches long and weighs about 1 pounds. You will likely feel the baby move between 18 and 20 weeks of pregnancy. Follow these instructions at home: Medicines  Take over-the-counter and prescription medicines only as told by your doctor. Some medicines are safe and some medicines are not safe during pregnancy.  Take a prenatal vitamin that contains at least 600 micrograms (mcg) of folic acid.  If you have trouble pooping (constipation), take medicine that will make your stool soft (stool softener) if your doctor approves. Eating and drinking   Eat regular, healthy  meals.  Avoid raw meat and uncooked cheese.  If you get low calcium from the food you eat, talk to your doctor about taking a daily calcium supplement.  Avoid foods that are high in fat and sugars, such as fried and sweet foods.  If you feel sick to your stomach (nauseous) or throw up (vomit): ? Eat 4 or 5 small meals a day instead of 3 large meals. ? Try eating a few soda crackers. ? Drink liquids between meals instead of during meals.  To prevent constipation: ? Eat foods that are high in fiber, like fresh fruits and vegetables, whole grains, and beans. ? Drink enough fluids to keep your pee (urine) clear or pale yellow. Activity  Exercise only as told by your doctor. Stop exercising if you start to have cramps.  Do not exercise if it is too hot, too humid, or  if you are in a place of great height (high altitude).  Avoid heavy lifting.  Wear low-heeled shoes. Sit and stand up straight.  You can continue to have sex unless your doctor tells you not to. Relieving pain and discomfort  Wear a good support bra if your breasts are tender.  Take warm water baths (sitz baths) to soothe pain or discomfort caused by hemorrhoids. Use hemorrhoid cream if your doctor approves.  Rest with your legs raised if you have leg cramps or low back pain.  If you develop puffy, bulging veins (varicose veins) in your legs: ? Wear support hose or compression stockings as told by your doctor. ? Raise (elevate) your feet for 15 minutes, 3-4 times a day. ? Limit salt in your food. Prenatal care  Write down your questions. Take them to your prenatal visits.  Keep all your prenatal visits as told by your doctor. This is important. Safety  Wear your seat belt when driving.  Make a list of emergency phone numbers, including numbers for family, friends, the hospital, and police and fire departments. General instructions  Ask your doctor about the right foods to eat or for help finding a counselor, if you need these services.  Ask your doctor about local prenatal classes. Begin classes before month 6 of your pregnancy.  Do not use hot tubs, steam rooms, or saunas.  Do not douche or use tampons or scented sanitary pads.  Do not cross your legs for long periods of time.  Visit your dentist if you have not done so. Use a soft toothbrush to brush your teeth. Floss gently.  Avoid all smoking, herbs, and alcohol. Avoid drugs that are not approved by your doctor.  Do not use any products that contain nicotine or tobacco, such as cigarettes and e-cigarettes. If you need help quitting, ask your doctor.  Avoid cat litter boxes and soil used by cats. These carry germs that can cause birth defects in the baby and can cause a loss of your baby (miscarriage) or  stillbirth. Contact a doctor if:  You have mild cramps or pressure in your lower belly.  You have pain when you pee (urinate).  You have bad smelling fluid coming from your vagina.  You continue to feel sick to your stomach (nauseous), throw up (vomit), or have watery poop (diarrhea).  You have a nagging pain in your belly area.  You feel dizzy. Get help right away if:  You have a fever.  You are leaking fluid from your vagina.  You have spotting or bleeding from your vagina.  You have severe belly  cramping or pain.  You lose or gain weight rapidly.  You have trouble catching your breath and have chest pain.  You notice sudden or extreme puffiness (swelling) of your face, hands, ankles, feet, or legs.  You have not felt the baby move in over an hour.  You have severe headaches that do not go away when you take medicine.  You have trouble seeing. Summary  The second trimester is from week 14 through week 27 (months 4 through 6). This is often the time in pregnancy that you feel your best.  To take care of yourself and your unborn baby, you will need to eat healthy meals, take medicines only if your doctor tells you to do so, and do activities that are safe for you and your baby.  Call your doctor if you get sick or if you notice anything unusual about your pregnancy. Also, call your doctor if you need help with the right food to eat, or if you want to know what activities are safe for you. This information is not intended to replace advice given to you by your health care provider. Make sure you discuss any questions you have with your health care provider. Document Released: 01/13/2010 Document Revised: 11/24/2016 Document Reviewed: 11/24/2016 Elsevier Interactive Patient Education  2019 [employer]

## 2018-11-03 NOTE — Progress Notes (Signed)
  Subjective:    Lindsay Simmons is being seen today for her first obstetrical visit.  This is not a planned pregnancy. She is at [redacted]w[redacted]d gestation. She has no significant obstetrical history, aside from one visit to [facility] for post-coital spotting that has since resolved. Relationship with FOB: significant other, living together. Patient does intend to breast feed. Pregnancy history fully reviewed.  Patient reports no complaints. Has not had any nausea/vomiting, denies heartburn. Has occasional headaches if she gets too hungry or thirsty, resolve with drinking/eating/Tylenol.   Review of Systems:   Review of Systems  Constitutional: Negative.   HENT: Negative.   Eyes: Negative.   Respiratory: Negative.   Cardiovascular: Negative.   Gastrointestinal: Negative.   Endocrine: Negative.   Genitourinary: Negative.  Negative for vaginal bleeding (None since [facility] visit).  Musculoskeletal: Negative.   Skin: Negative.   Allergic/Immunologic: Negative.   Neurological: Negative.   Hematological: Negative.   Psychiatric/Behavioral: Negative.     Objective:     BP (!) 102/45   Pulse 73   Wt 64.2 kg   LMP 07/21/2018 (Exact Date)   BMI 24.31 kg/m  Physical Exam  Nursing note and vitals reviewed. Constitutional: She is oriented to person, place, and time. She appears well-developed and well-nourished. No distress.  HENT:  Head: Normocephalic.  Eyes: Right eye exhibits no discharge. Left eye exhibits no discharge.  Neck: Normal range of motion.  Cardiovascular: Normal rate and regular rhythm.  Murmur (S3 murmur heard, denies ever being told she has a murmur) heard. Respiratory: Effort normal and breath sounds normal. No respiratory distress.  GI: Soft. Bowel sounds are normal. She exhibits no distension. There is no abdominal tenderness.  Genitourinary:    Vulva, vagina and uterus normal.   Musculoskeletal: Normal range of motion.        General: No edema.  Neurological: She is alert and  oriented to person, place, and time.  Skin: Skin is warm and dry.  Psychiatric: She has a normal mood and affect. Her behavior is normal. Judgment and thought content normal.    Maternal Exam:  Abdomen: Patient reports no abdominal tenderness. Fundal height is 15cm.    Introitus: Normal vulva. Normal vagina.  Pelvis: adequate for delivery.   Cervix: Cervix evaluated by digital exam.     Fetal Exam Fetal Monitor Review: Mode: hand-held doppler probe.   Baseline rate: 159.         Assessment:    Pregnancy: G1P0 Patient Active Problem List   Diagnosis Date Noted  . Supervision of normal first pregnancy, antepartum 10/19/2018       Plan:     Initial labs reviewed. Prenatal vitamins being taken. Problem list reviewed and updated. AFP3 discussed: undecided. Role of ultrasound in pregnancy discussed; fetal survey: ordered. Amniocentesis discussed: not indicated. Follow up in 4 weeks. 50% of 40 min visit spent on counseling and coordination of care.  Referral to cardiology for evaluation of heart murmur.  Bernerd Limbo, SNM 11/03/2018

## 2018-11-04 LAB — CYTOLOGY - PAP
Bacterial vaginitis: POSITIVE — AB
Candida vaginitis: NEGATIVE
Chlamydia: NEGATIVE
Diagnosis: NEGATIVE
Neisseria Gonorrhea: NEGATIVE
Trichomonas: NEGATIVE

## 2018-11-09 ENCOUNTER — Encounter: Payer: Self-pay | Admitting: General Practice

## 2018-11-09 ENCOUNTER — Telehealth: Payer: Self-pay | Admitting: General Practice

## 2018-11-09 NOTE — Telephone Encounter (Signed)
Unable to reach patient via phone to inform patient of appointment time change on 12/01/18 due to provider will not be available.  Letter mailed to patient.

## 2018-11-21 ENCOUNTER — Encounter: Payer: Self-pay | Admitting: General Practice

## 2018-11-25 ENCOUNTER — Encounter (HOSPITAL_COMMUNITY): Payer: Self-pay

## 2018-12-01 ENCOUNTER — Encounter: Payer: Medicaid Other | Admitting: Obstetrics and Gynecology

## 2018-12-02 ENCOUNTER — Other Ambulatory Visit: Payer: Self-pay | Admitting: Obstetrics and Gynecology

## 2018-12-02 ENCOUNTER — Other Ambulatory Visit (HOSPITAL_COMMUNITY): Payer: Self-pay | Admitting: Radiology

## 2018-12-02 ENCOUNTER — Ambulatory Visit (HOSPITAL_COMMUNITY)
Admission: RE | Admit: 2018-12-02 | Discharge: 2018-12-02 | Disposition: A | Discharge status: Home / Self Care | Payer: BLUE CROSS/BLUE SHIELD | Source: Ambulatory Visit | Attending: Obstetrics and Gynecology | Admitting: Obstetrics and Gynecology

## 2018-12-02 ENCOUNTER — Other Ambulatory Visit (HOSPITAL_COMMUNITY): Payer: Self-pay | Admitting: *Deleted

## 2018-12-02 DIAGNOSIS — O26872 Cervical shortening, second trimester: Secondary | ICD-10-CM

## 2018-12-02 DIAGNOSIS — Z3A19 19 weeks gestation of pregnancy: Secondary | ICD-10-CM | POA: Diagnosis not present

## 2018-12-02 DIAGNOSIS — Z3400 Encounter for supervision of normal first pregnancy, unspecified trimester: Secondary | ICD-10-CM | POA: Insufficient documentation

## 2018-12-02 DIAGNOSIS — Z363 Encounter for antenatal screening for malformations: Secondary | ICD-10-CM | POA: Diagnosis not present

## 2018-12-02 DIAGNOSIS — Z3686 Encounter for antenatal screening for cervical length: Secondary | ICD-10-CM | POA: Diagnosis not present

## 2018-12-02 DIAGNOSIS — Z362 Encounter for other antenatal screening follow-up: Secondary | ICD-10-CM

## 2018-12-02 MED ORDER — PROGESTERONE MICRONIZED 200 MG PO CAPS: ORAL_CAPSULE | ORAL | 3 refills | Status: DC

## 2018-12-15 ENCOUNTER — Ambulatory Visit (INDEPENDENT_AMBULATORY_CARE_PROVIDER_SITE_OTHER): Payer: Medicaid Other | Admitting: Obstetrics and Gynecology

## 2018-12-15 VITALS — BP 107/65 | HR 71 | Wt 156.0 lb

## 2018-12-15 DIAGNOSIS — O26872 Cervical shortening, second trimester: Secondary | ICD-10-CM

## 2018-12-15 DIAGNOSIS — Z3A21 21 weeks gestation of pregnancy: Secondary | ICD-10-CM

## 2018-12-15 DIAGNOSIS — O0992 Supervision of high risk pregnancy, unspecified, second trimester: Secondary | ICD-10-CM

## 2018-12-15 NOTE — Progress Notes (Signed)
   PRENATAL VISIT NOTE  Subjective:  Lindsay Simmons is a 23 y.o. G1P0 at [redacted]w[redacted]d being seen today for ongoing prenatal care.  She is currently monitored for the following issues for this low-risk pregnancy and has Supervision of normal first pregnancy, antepartum on their problem list.  Patient reports no complaints.  Contractions: Not present. Vag. Bleeding: None.  Movement: Present. Denies leaking of fluid. Considering a delivery at [facility]. Had questions about traveling with her shortened cervix.   The following portions of the patient's history were reviewed and updated as appropriate: allergies, current medications, past family history, past medical history, past social history, past surgical history and problem list. Problem list updated.  Objective:   Vitals:   12/15/18 1615  BP: 107/65  Pulse: 71  Weight: 70.8 kg    Fetal Status: Fetal Heart Rate (bpm): 156(Simultaneous filing. User may not have seen previous data.) Fundal Height: 21 cm Movement: Present     General:  Alert, oriented and cooperative. Patient is in no acute distress.  Skin: Skin is warm and dry. No rash noted.   Cardiovascular: Normal heart rate noted  Respiratory: Normal respiratory effort, no problems with respiration noted  Abdomen: Soft, gravid, appropriate for gestational age.  Pain/Pressure: Absent     Pelvic: Cervical exam deferred        Extremities: Normal range of motion.  Edema: None  Mental Status: Normal mood and affect. Normal behavior. Normal judgment and thought content.   Assessment and Plan:  Pregnancy: G1P0 at [redacted]w[redacted]d  1. Supervision of high risk pregnancy in second trimester - Advised her to consider not traveling in her 3rd trimester or take a copy of her records.  2. Short cervix during pregnancy in second trimester - Referred to MD care  Preterm labor symptoms and general obstetric precautions including but not limited to vaginal bleeding, contractions, leaking of fluid and  fetal movement were reviewed in detail with the patient. Please refer to After Visit Summary for other counseling recommendations.   Future Appointments  Date Time Provider Department Center  12/23/2018  3:30 PM [facility] [place] [facility]  01/10/2019  3:15 PM Naturita Bing, MD [facility]    Bernerd Limbo, [facility]

## 2018-12-17 ENCOUNTER — Encounter (HOSPITAL_COMMUNITY): Payer: Self-pay | Admitting: *Deleted

## 2018-12-17 ENCOUNTER — Inpatient Hospital Stay (HOSPITAL_BASED_OUTPATIENT_CLINIC_OR_DEPARTMENT_OTHER): Payer: BLUE CROSS/BLUE SHIELD

## 2018-12-17 ENCOUNTER — Inpatient Hospital Stay (HOSPITAL_COMMUNITY)
Admission: AD | Admit: 2018-12-17 | Discharge: 2018-12-17 | Disposition: A | Discharge status: Home / Self Care | Payer: BLUE CROSS/BLUE SHIELD | Source: Ambulatory Visit | Attending: Obstetrics and Gynecology | Admitting: Obstetrics and Gynecology

## 2018-12-17 DIAGNOSIS — O26892 Other specified pregnancy related conditions, second trimester: Secondary | ICD-10-CM | POA: Diagnosis not present

## 2018-12-17 DIAGNOSIS — N949 Unspecified condition associated with female genital organs and menstrual cycle: Secondary | ICD-10-CM | POA: Diagnosis not present

## 2018-12-17 DIAGNOSIS — O23592 Infection of other part of genital tract in pregnancy, second trimester: Secondary | ICD-10-CM | POA: Diagnosis not present

## 2018-12-17 DIAGNOSIS — Z3A21 21 weeks gestation of pregnancy: Secondary | ICD-10-CM

## 2018-12-17 DIAGNOSIS — N760 Acute vaginitis: Secondary | ICD-10-CM

## 2018-12-17 DIAGNOSIS — O26872 Cervical shortening, second trimester: Secondary | ICD-10-CM | POA: Diagnosis not present

## 2018-12-17 DIAGNOSIS — B9689 Other specified bacterial agents as the cause of diseases classified elsewhere: Secondary | ICD-10-CM | POA: Diagnosis not present

## 2018-12-17 DIAGNOSIS — R102 Pelvic and perineal pain: Secondary | ICD-10-CM | POA: Diagnosis present

## 2018-12-17 LAB — URINALYSIS, ROUTINE W REFLEX MICROSCOPIC
Bilirubin Urine: NEGATIVE
Glucose, UA: NEGATIVE mg/dL
Hgb urine dipstick: NEGATIVE
Ketones, ur: 15 mg/dL — AB
Leukocytes,Ua: NEGATIVE
Nitrite: NEGATIVE
PROTEIN: NEGATIVE mg/dL
Specific Gravity, Urine: >1.03 — ABNORMAL HIGH (ref 1.005–1.030)
pH: 5 (ref 5.0–8.0)

## 2018-12-17 LAB — WET PREP, GENITAL
Sperm: NONE SEEN
Trich, Wet Prep: NONE SEEN
Yeast Wet Prep HPF POC: NONE SEEN

## 2018-12-17 MED ORDER — METRONIDAZOLE 500 MG PO TABS: 500.0000 mg | ORAL_TABLET | Freq: Two times a day (BID) | ORAL | 0 refills | Status: DC

## 2018-12-17 NOTE — Discharge Instructions (Signed)
Bacterial Vaginosis    Bacterial vaginosis is a vaginal infection that occurs when the normal balance of bacteria in the vagina is disrupted. It results from an overgrowth of certain bacteria. This is the most common vaginal infection among women ages 15-44.  Because bacterial vaginosis increases your risk for STIs (sexually transmitted infections), getting treated can help reduce your risk for chlamydia, gonorrhea, herpes, and HIV (human immunodeficiency virus). Treatment is also important for preventing complications in pregnant women, because this condition can cause an early (premature) delivery.  What are the causes?  This condition is caused by an increase in harmful bacteria that are normally present in small amounts in the vagina. However, the reason that the condition develops is not fully understood.  What increases the risk?  The following factors may make you more likely to develop this condition:  · Having a new sexual partner or multiple sexual partners.  · Having unprotected sex.  · Douching.  · Having an intrauterine device (IUD).  · Smoking.  · Drug and alcohol abuse.  · Taking certain antibiotic medicines.  · Being pregnant.  You cannot get bacterial vaginosis from toilet seats, bedding, swimming pools, or contact with objects around you.  What are the signs or symptoms?  Symptoms of this condition include:  · Grey or white vaginal discharge. The discharge can also be watery or foamy.  · A fish-like odor with discharge, especially after sexual intercourse or during menstruation.  · Itching in and around the vagina.  · Burning or pain with urination.  Some women with bacterial vaginosis have no signs or symptoms.  How is this diagnosed?  This condition is diagnosed based on:  · Your medical history.  · A physical exam of the vagina.  · Testing a sample of vaginal fluid under a microscope to look for a large amount of bad bacteria or abnormal cells. Your health care provider may use a cotton swab or  a small wooden spatula to collect the sample.  How is this treated?  This condition is treated with antibiotics. These may be given as a pill, a vaginal cream, or a medicine that is put into the vagina (suppository). If the condition comes back after treatment, a second round of antibiotics may be needed.  Follow these instructions at home:  Medicines  · Take over-the-counter and prescription medicines only as told by your health care provider.  · Take or use your antibiotic as told by your health care provider. Do not stop taking or using the antibiotic even if you start to feel better.  General instructions  · If you have a female sexual partner, tell her that you have a vaginal infection. She should see her health care provider and be treated if she has symptoms. If you have a female sexual partner, he does not need treatment.  · During treatment:  ? Avoid sexual activity until you finish treatment.  ? Do not douche.  ? Avoid alcohol as directed by your health care provider.  ? Avoid breastfeeding as directed by your health care provider.  · Drink enough water and fluids to keep your urine clear or pale yellow.  · Keep the area around your vagina and rectum clean.  ? Wash the area daily with warm water.  ? Wipe yourself from front to back after using the toilet.  · Keep all follow-up visits as told by your health care provider. This is important.  How is this prevented?  · Do not   douche.  · Wash the outside of your vagina with warm water only.  · Use protection when having sex. This includes latex condoms and dental dams.  · Limit how many sexual partners you have. To help prevent bacterial vaginosis, it is best to have sex with just one partner (monogamous).  · Make sure you and your sexual partner are tested for STIs.  · Wear cotton or cotton-lined underwear.  · Avoid wearing tight pants and pantyhose, especially during summer.  · Limit the amount of alcohol that you drink.  · Do not use any products that contain  nicotine or tobacco, such as cigarettes and e-cigarettes. If you need help quitting, ask your health care provider.  · Do not use illegal drugs.  Where to find more information  · Centers for Disease Control and Prevention: www.cdc.gov/std  · American Sexual Health Association (ASHA): www.ashastd.org  · U.S. Department of Health and Human Services, Office on Women's Health: www.womenshealth.gov/ or https://www.womenshealth.gov/a-z-topics/bacterial-vaginosis  Contact a health care provider if:  · Your symptoms do not improve, even after treatment.  · You have more discharge or pain when urinating.  · You have a fever.  · You have pain in your abdomen.  · You have pain during sex.  · You have vaginal bleeding between periods.  Summary  · Bacterial vaginosis is a vaginal infection that occurs when the normal balance of bacteria in the vagina is disrupted.  · Because bacterial vaginosis increases your risk for STIs (sexually transmitted infections), getting treated can help reduce your risk for chlamydia, gonorrhea, herpes, and HIV (human immunodeficiency virus). Treatment is also important for preventing complications in pregnant women, because the condition can cause an early (premature) delivery.  · This condition is treated with antibiotic medicines. These may be given as a pill, a vaginal cream, or a medicine that is put into the vagina (suppository).  This information is not intended to replace advice given to you by your health care provider. Make sure you discuss any questions you have with your health care provider.  Document Released: 10/19/2005 Document Revised: 02/22/2017 Document Reviewed: 07/04/2016  Elsevier Interactive Patient Education © 2019 [employer] Inc[employer]

## 2018-12-17 NOTE — MAU Provider Note (Signed)
Chief Complaint: Vaginal Pain   First Provider Initiated Contact with Patient 12/17/18 1629     SUBJECTIVE HPI: Lindsay Simmons is a 23 y.o. G1P0 at [redacted]w[redacted]d with a shortened cervix (2.3 cm) diagnosed on anatomy ultrasound on 12/02/2018 who presents to [facility] reporting jabbing sensation in her cervix and vagina.  Is taking vaginal progesterone as directed.  Location: Cervix, vagina Quality: Jabbing Severity: Moderate Duration: 1 day Context: [redacted] weeks gestation, known short cervix Timing: Intermittent Modifying factors: None Associated signs and symptoms: Negative for fever, chills, urinary complaints, vaginal bleeding, leaking of fluid, vaginal discharge, abdominal pain.  Past Medical History:  Diagnosis Date  . Anemia    OB History  Gravida Para Term Preterm AB Living  1            SAB TAB Ectopic Multiple Live Births               # Outcome Date GA Lbr Len/2nd Weight Sex Delivery Anes PTL Lv  1 Current            Past Surgical History:  Procedure Laterality Date  . NO PAST SURGERIES     Social History   Socioeconomic History  . Marital status: Single    Spouse name: Not on file  . Number of children: Not on file  . Years of education: college  . Highest education level: Bachelor's degree (e.g., BA, AB, BS)  Occupational History  . Not on file  Social Needs  . Financial resource strain: Not hard at all  . Food insecurity:    Worry: Never true    Inability: Never true  . Transportation needs:    Medical: No    Non-medical: No  Tobacco Use  . Smoking status: Never Smoker  . Smokeless tobacco: Never Used  Substance and Sexual Activity  . Alcohol use: Never    Frequency: Never  . Drug use: Never  . Sexual activity: Yes    Birth control/protection: None  Lifestyle  . Physical activity:    Days per week: 4 days    Minutes per session: 60 min  . Stress: Not at all  Relationships  . Social connections:    Talks on phone: Three times a week    Gets  together: Three times a week    Attends religious service: Never    Active member of club or organization: No    Attends meetings of clubs or organizations: Never    Relationship status: Living with partner  . Intimate partner violence:    Fear of current or ex partner: No    Emotionally abused: No    Physically abused: No    Forced sexual activity: No  Other Topics Concern  . Not on file  Social History Narrative  . Not on file   History reviewed. No pertinent family history. No current facility-administered medications on file prior to encounter.    Current Outpatient Medications on File Prior to Encounter  Medication Sig Dispense Refill  . Prenat-Fe Carbonyl-FA-Omega 3 (ONE-A-DAY WOMENS PRENATAL 1) 28-0.8-235 MG CAPS Take 1 capsule by mouth daily. 30 capsule 12  . progesterone (PROMETRIUM) 200 MG capsule Place one capsule vaginally at bedtime 30 capsule 3   No Known Allergies  I have reviewed patient's Past Medical Hx, Surgical Hx, Family Hx, Social Hx, medications and allergies.   Review of Systems  Constitutional: Negative for chills and fever.  Gastrointestinal: Negative for abdominal pain.  Genitourinary: Positive for pelvic pain. Negative for dysuria,  flank pain, frequency, hematuria, urgency, vaginal bleeding and vaginal discharge.  Musculoskeletal: Negative for back pain.    OBJECTIVE Patient Vitals for the past 24 hrs:  BP Temp Temp src Pulse SpO2  12/17/18 1618 (!) 116/52 (!) 97.4 F (36.3 C) Oral 74 -  12/17/18 1617 - - - - 100 %   Constitutional: Well-developed, well-nourished female in no acute distress.  Cardiovascular: normal rate Respiratory: normal rate and effort.  GI: Abd soft, non-tender, gravid appropriate for gestational age.  MS: Extremities nontender, no edema, normal ROM Neurologic: Alert and oriented x 4.  GU:  SPECULUM EXAM: NEFG, physiologic discharge mixed with possible particles of progesterone [place], no blood noted, cervix incompletely  visualized due to patient intolerance of exam, but no obvious dilation or bulging bag of water.    BIMANUAL: cervix closed; uterus 20-week size, no adnexal tenderness or masses. No CMT.  LAB RESULTS Results for orders placed or performed during the hospital encounter of 12/17/18 (from the past 24 hour(s))  Urinalysis, Routine w reflex microscopic     Status: Abnormal   Collection Time: 12/17/18  4:20 PM  Result Value Ref Range   Color, Urine YELLOW YELLOW   APPearance CLEAR CLEAR   Specific Gravity, Urine >1.030 (H) 1.005 - 1.030   pH 5.0 5.0 - 8.0   Glucose, UA NEGATIVE NEGATIVE mg/dL   Hgb urine dipstick NEGATIVE NEGATIVE   Bilirubin Urine NEGATIVE NEGATIVE   Ketones, ur 15 (A) NEGATIVE mg/dL   Protein, ur NEGATIVE NEGATIVE mg/dL   Nitrite NEGATIVE NEGATIVE   Leukocytes,Ua NEGATIVE NEGATIVE  Wet prep, genital     Status: Abnormal   Collection Time: 12/17/18  4:49 PM  Result Value Ref Range   Yeast Wet Prep HPF POC NONE SEEN NONE SEEN   Trich, Wet Prep NONE SEEN NONE SEEN   Clue Cells Wet Prep HPF POC PRESENT (A) NONE SEEN   WBC, Wet Prep HPF POC FEW (A) NONE SEEN   Sperm NONE SEEN     IMAGING Preliminary ultrasound results show stable cervical length 2.14 cm without funneling.  Baby footling breech.  MAU COURSE Orders Placed This Encounter  Procedures  . Wet prep, genital  . US MFM OB TRANSVAGINAL  . US MFM OB Limited  . Urinalysis, Routine w reflex microscopic  . Discharge patient   Meds ordered this encounter  Medications  . metroNIDAZOLE (FLAGYL) 500 MG tablet    Sig: Take 1 tablet (500 mg total) by mouth 2 (two) times daily.    Dispense:  14 tablet    Refill:  0    Order Specific Question:   Supervising Provider    Answer:   Omega BingPICKENS, CHARLIE [[id number]]    MDM - Pelvic pressure with short but stable cervical length.  No evidence of active preterm labor.  Continue vaginal progesterone.  Pressure may be exacerbated by bacterial vaginosis.  Will treat with  Flagyl.   ASSESSMENT 1. Short cervical length during pregnancy in second trimester Active  2. Pelvic pressure in pregnancy, antepartum, second trimester   3. BV (bacterial vaginosis)     PLAN Discharge home in stable condition. Preterm labor precautions Increase fluids and rest Follow-up Information    [facility] Follow up on 12/23/2018.   Specialty:  Maternal and Fetal Medicine Why:  As scheduled for follow-up ultrasound Contact information: [address] [id number] [place] [id number] [phone]       [facility] Follow up  on 01/10/2019.   Specialty:  Obstetrics and Gynecology Why:  As scheduled for prenatal care Contact information: [address] [place] [address] [phone]         Allergies as of 12/17/2018   No Known Allergies     Medication List    TAKE these medications   metroNIDAZOLE 500 MG tablet Commonly known as:  FLAGYL Take 1 tablet (500 mg total) by mouth 2 (two) times daily.   ONE-A-DAY WOMENS PRENATAL 1 28-0.8-235 MG Caps Take 1 capsule by mouth daily.   progesterone 200 MG capsule Commonly known as:  PROMETRIUM Place one capsule vaginally at bedtime        [place], [place], [place] 12/17/2018  6:27 PM

## 2018-12-17 NOTE — MAU Note (Signed)
Pt states she has been feeling what she read to be hiccups or kicking that feels close to her vagina.  She said it feels like it is happening more often today.  She says it is uncomfortable when it happens.  Denies LOF/VB.  Last intercourse was 12/16/2018.

## 2018-12-19 LAB — GC/CHLAMYDIA PROBE AMP (~~LOC~~) NOT AT ARMC
Chlamydia: NEGATIVE
Neisseria Gonorrhea: NEGATIVE

## 2018-12-23 ENCOUNTER — Ambulatory Visit (HOSPITAL_COMMUNITY)
Admission: RE | Admit: 2018-12-23 | Discharge: 2018-12-23 | Disposition: A | Discharge status: Home / Self Care | Payer: BLUE CROSS/BLUE SHIELD | Source: Ambulatory Visit | Attending: Obstetrics and Gynecology | Admitting: Obstetrics and Gynecology

## 2018-12-23 ENCOUNTER — Encounter (HOSPITAL_COMMUNITY): Payer: Self-pay

## 2018-12-23 ENCOUNTER — Ambulatory Visit (HOSPITAL_COMMUNITY): Payer: BLUE CROSS/BLUE SHIELD

## 2018-12-23 DIAGNOSIS — Z3400 Encounter for supervision of normal first pregnancy, unspecified trimester: Secondary | ICD-10-CM | POA: Insufficient documentation

## 2018-12-23 DIAGNOSIS — Z3686 Encounter for antenatal screening for cervical length: Secondary | ICD-10-CM

## 2018-12-23 DIAGNOSIS — Z3A22 22 weeks gestation of pregnancy: Secondary | ICD-10-CM

## 2018-12-23 DIAGNOSIS — O26872 Cervical shortening, second trimester: Secondary | ICD-10-CM

## 2018-12-23 DIAGNOSIS — Z362 Encounter for other antenatal screening follow-up: Secondary | ICD-10-CM | POA: Insufficient documentation

## 2018-12-27 ENCOUNTER — Other Ambulatory Visit (HOSPITAL_COMMUNITY): Payer: Self-pay | Admitting: *Deleted

## 2018-12-27 DIAGNOSIS — O26879 Cervical shortening, unspecified trimester: Secondary | ICD-10-CM

## 2018-12-27 NOTE — Progress Notes (Signed)
Us m 

## 2019-01-05 ENCOUNTER — Ambulatory Visit (HOSPITAL_COMMUNITY): Payer: BLUE CROSS/BLUE SHIELD | Admitting: *Deleted

## 2019-01-05 ENCOUNTER — Encounter (HOSPITAL_COMMUNITY): Payer: Self-pay

## 2019-01-05 ENCOUNTER — Ambulatory Visit (HOSPITAL_COMMUNITY)
Admission: RE | Admit: 2019-01-05 | Discharge: 2019-01-05 | Disposition: A | Discharge status: Home / Self Care | Payer: BLUE CROSS/BLUE SHIELD | Source: Ambulatory Visit | Attending: Maternal & Fetal Medicine | Admitting: Maternal & Fetal Medicine

## 2019-01-05 VITALS — BP 114/57 | HR 61 | Wt 157.0 lb

## 2019-01-05 DIAGNOSIS — Z3A24 24 weeks gestation of pregnancy: Secondary | ICD-10-CM

## 2019-01-05 DIAGNOSIS — Z3400 Encounter for supervision of normal first pregnancy, unspecified trimester: Secondary | ICD-10-CM

## 2019-01-05 DIAGNOSIS — O26872 Cervical shortening, second trimester: Secondary | ICD-10-CM

## 2019-01-05 DIAGNOSIS — O26879 Cervical shortening, unspecified trimester: Secondary | ICD-10-CM

## 2019-01-06 ENCOUNTER — Ambulatory Visit (HOSPITAL_COMMUNITY): Payer: BLUE CROSS/BLUE SHIELD

## 2019-01-08 DIAGNOSIS — R011 Cardiac murmur, unspecified: Secondary | ICD-10-CM | POA: Insufficient documentation

## 2019-01-08 NOTE — Progress Notes (Deleted)
Cardiology Office Note    Date:  01/08/2019   ID:  Lindsay Simmons, DOB 1995/12/11, MRN [id number]  PCP:  Patient, No Pcp Per  Cardiologist:  Armanda Magic, MD   No chief complaint on file.   History of Present Illness:  Lindsay Simmons is a 23 y.o. female who is being seen today for the evaluation of heart murmur at the request of Lindsay Simmons, CNM.  Is a very pleasant 23 year old female who is currently pregnant at [redacted] weeks with her first child.  She was recently noted on 1 of her perinatal visits to have a heart murmur and is now referred to cardiology for further evaluation.    She denies any chest pain or pressure, SOB, DOE, PND, orthopnea, LE edema, dizziness, palpitations or syncope. She is compliant with her meds and is tolerating meds with no SE.    Past Medical History:  Diagnosis Date  . Anemia     Past Surgical History:  Procedure Laterality Date  . NO PAST SURGERIES      Current Medications: No outpatient medications have been marked as taking for the 01/09/19 encounter (Appointment) with Quintella Reichert, MD.    Allergies:   Patient has no known allergies.   Social History   Socioeconomic History  . Marital status: Single    Spouse name: Not on file  . Number of children: Not on file  . Years of education: college  . Highest education level: Bachelor's degree (e.g., BA, AB, BS)  Occupational History  . Not on file  Social Needs  . Financial resource strain: Not hard at all  . Food insecurity:    Worry: Never true    Inability: Never true  . Transportation needs:    Medical: No    Non-medical: No  Tobacco Use  . Smoking status: Never Smoker  . Smokeless tobacco: Never Used  Substance and Sexual Activity  . Alcohol use: Never    Frequency: Never  . Drug use: Never  . Sexual activity: Yes    Birth control/protection: None  Lifestyle  . Physical activity:    Days per week: 4 days    Minutes per session: 60 min  . Stress: Not at all  Relationships    . Social connections:    Talks on phone: Three times a week    Gets together: Three times a week    Attends religious service: Never    Active member of club or organization: No    Attends meetings of clubs or organizations: Never    Relationship status: Living with partner  Other Topics Concern  . Not on file  Social History Narrative  . Not on file     Family History:  The patient's family history is not on file.   ROS:   Please see the history of present illness.    ROS All other systems reviewed and are negative.  No flowsheet data found.     PHYSICAL EXAM:   VS:  LMP 07/21/2018 (Exact Date)    GEN: Well nourished, well developed, in no acute distress  HEENT: normal  Neck: no JVD, carotid bruits, or masses Cardiac: RRR; no murmurs, rubs, or gallops,no edema.  Intact distal pulses bilaterally.  Respiratory:  clear to auscultation bilaterally, normal work of breathing GI: soft, nontender, nondistended, + BS MS: no deformity or atrophy  Skin: warm and dry, no rash Neuro:  Alert and Oriented x 3, Strength and sensation are intact Psych: euthymic mood, full  affect  Wt Readings from Last 3 Encounters:  01/05/19 157 lb (71.2 kg)  12/23/18 151 lb 4 oz (68.6 kg)  12/15/18 156 lb (70.8 kg)      Studies/Labs Reviewed:   EKG:  EKG is  ordered today.  The ekg ordered today demonstrates ***  Recent Labs: 10/19/2018: Hemoglobin 10.7; Platelets 211   Lipid Panel No results found for: CHOL, TRIG, HDL, CHOLHDL, VLDL, LDLCALC, LDLDIRECT  Additional studies/ records that were reviewed today include:  Office notes from PCP    ASSESSMENT:    1. Heart murmur      PLAN:  In order of problems listed above:  1.  Heart murmur - she does have a heart murmur on exam but is likely a flow murmur due to pregnancy with increased blood volume.  I will get a 2D echocardiogram to assess.  She is completely asymptomatic.    Medication Adjustments/Labs and Tests  Ordered: Current medicines are reviewed at length with the patient today.  Concerns regarding medicines are outlined above.  Medication changes, Labs and Tests ordered today are listed in the Patient Instructions below.  There are no Patient Instructions on file for this visit.   Signed, Armanda Magic, MD  01/08/2019 6:57 PM    [facility] [address] [place], [place], [place]  [address] Phone: [phone]; Fax: [phone]

## 2019-01-09 ENCOUNTER — Ambulatory Visit: Payer: Medicaid Other | Admitting: Cardiology

## 2019-01-10 ENCOUNTER — Encounter: Payer: Medicaid Other | Admitting: Obstetrics and Gynecology

## 2019-01-11 ENCOUNTER — Encounter: Payer: Self-pay | Admitting: Obstetrics and Gynecology

## 2019-01-11 NOTE — Progress Notes (Signed)
Patient did not keep her OB appointment for 01/10/2019.  Cornelia Copa MD Attending Center for [employer])

## 2019-01-19 ENCOUNTER — Ambulatory Visit (HOSPITAL_COMMUNITY): Admission: RE | Admit: 2019-01-19 | Payer: BLUE CROSS/BLUE SHIELD | Source: Ambulatory Visit

## 2019-01-19 ENCOUNTER — Ambulatory Visit (HOSPITAL_COMMUNITY): Payer: BLUE CROSS/BLUE SHIELD

## 2019-01-27 ENCOUNTER — Ambulatory Visit (HOSPITAL_COMMUNITY)
Admission: RE | Admit: 2019-01-27 | Discharge: 2019-01-27 | Disposition: A | Discharge status: Home / Self Care | Payer: BLUE CROSS/BLUE SHIELD | Source: Ambulatory Visit | Attending: Maternal & Fetal Medicine | Admitting: Maternal & Fetal Medicine

## 2019-01-27 ENCOUNTER — Other Ambulatory Visit: Payer: Self-pay

## 2019-01-27 ENCOUNTER — Ambulatory Visit (HOSPITAL_COMMUNITY): Payer: BLUE CROSS/BLUE SHIELD | Admitting: *Deleted

## 2019-01-27 VITALS — BP 114/45 | HR 76 | Temp 98.9°F

## 2019-01-27 DIAGNOSIS — O26879 Cervical shortening, unspecified trimester: Secondary | ICD-10-CM | POA: Diagnosis not present

## 2019-01-27 DIAGNOSIS — O26872 Cervical shortening, second trimester: Secondary | ICD-10-CM | POA: Diagnosis not present

## 2019-01-27 DIAGNOSIS — Z3A27 27 weeks gestation of pregnancy: Secondary | ICD-10-CM

## 2019-01-27 DIAGNOSIS — O0990 Supervision of high risk pregnancy, unspecified, unspecified trimester: Secondary | ICD-10-CM | POA: Insufficient documentation

## 2019-01-27 DIAGNOSIS — Z362 Encounter for other antenatal screening follow-up: Secondary | ICD-10-CM

## 2019-03-06 ENCOUNTER — Other Ambulatory Visit (HOSPITAL_COMMUNITY): Payer: Self-pay | Admitting: *Deleted

## 2019-03-07 ENCOUNTER — Ambulatory Visit (HOSPITAL_COMMUNITY)
Admission: RE | Admit: 2019-03-07 | Discharge: 2019-03-07 | Disposition: A | Discharge status: Home / Self Care | Payer: BLUE CROSS/BLUE SHIELD | Source: Ambulatory Visit | Attending: Obstetrics and Gynecology | Admitting: Obstetrics and Gynecology

## 2019-03-07 ENCOUNTER — Other Ambulatory Visit: Payer: Self-pay

## 2019-03-07 DIAGNOSIS — O99019 Anemia complicating pregnancy, unspecified trimester: Secondary | ICD-10-CM | POA: Insufficient documentation

## 2019-03-07 DIAGNOSIS — Z3A00 Weeks of gestation of pregnancy not specified: Secondary | ICD-10-CM | POA: Diagnosis not present

## 2019-03-07 MED ORDER — SODIUM CHLORIDE 0.9 % IV SOLN
510.0000 mg | INTRAVENOUS | Status: DC
  Administered 2019-03-07: 510 mg via INTRAVENOUS
  Filled 2019-03-07: qty 510

## 2019-03-07 NOTE — Discharge Instructions (Signed)

## 2019-03-18 ENCOUNTER — Other Ambulatory Visit: Payer: Self-pay | Admitting: Obstetrics and Gynecology

## 2019-03-21 ENCOUNTER — Other Ambulatory Visit: Payer: Self-pay

## 2019-03-21 ENCOUNTER — Ambulatory Visit (HOSPITAL_COMMUNITY)
Admission: RE | Admit: 2019-03-21 | Discharge: 2019-03-21 | Disposition: A | Discharge status: Home / Self Care | Payer: BLUE CROSS/BLUE SHIELD | Source: Ambulatory Visit | Attending: Obstetrics and Gynecology | Admitting: Obstetrics and Gynecology

## 2019-03-21 DIAGNOSIS — D649 Anemia, unspecified: Secondary | ICD-10-CM | POA: Diagnosis not present

## 2019-03-21 DIAGNOSIS — O99019 Anemia complicating pregnancy, unspecified trimester: Secondary | ICD-10-CM | POA: Insufficient documentation

## 2019-03-21 DIAGNOSIS — Z3A00 Weeks of gestation of pregnancy not specified: Secondary | ICD-10-CM | POA: Insufficient documentation

## 2019-03-21 MED ORDER — SODIUM CHLORIDE 0.9 % IV SOLN
510.0000 mg | INTRAVENOUS | Status: DC
  Administered 2019-03-21: 510 mg via INTRAVENOUS
  Filled 2019-03-21: qty 510

## 2019-03-28 ENCOUNTER — Telehealth: Payer: Self-pay | Admitting: Cardiology

## 2019-03-28 NOTE — Telephone Encounter (Signed)
LMTCB to discuss converting 03/30/19 appointment to a virtual and to obtain consent.

## 2019-03-28 NOTE — Telephone Encounter (Signed)
..   Virtual Visit Pre-Appointment Phone Call  "(Name), I am calling you today to discuss your upcoming appointment. We are currently trying to limit exposure to the virus that causes COVID-19 by seeing patients at home rather than in the office."  1. "What is the BEST phone number to call the day of the visit?" - include this in appointment notes  2. "Do you have or have access to (through a family member/friend) a smartphone with video capability that we can use for your visit?" a. If yes - list this number in appt notes as "cell" (if different from BEST phone #) and list the appointment type as a VIDEO visit in appointment notes b. If no - list the appointment type as a PHONE visit in appointment notes  3. Confirm consent - "In the setting of the current Covid19 crisis, you are scheduled for a (phone or video) visit with your provider on (date) at (time).  Just as we do with many in-office visits, in order for you to participate in this visit, we must obtain consent.  If you'd like, I can send this to your mychart (if signed up) or email for you to review.  Otherwise, I can obtain your verbal consent now.  All virtual visits are billed to your insurance company just like a normal visit would be.  By agreeing to a virtual visit, we'd like you to understand that the technology does not allow for your provider to perform an examination, and thus may limit your provider's ability to fully assess your condition. If your provider identifies any concerns that need to be evaluated in person, we will make arrangements to do so.  Finally, though the technology is pretty good, we cannot assure that it will always work on either your or our end, and in the setting of a video visit, we may have to convert it to a phone-only visit.  In either situation, we cannot ensure that we have a secure connection.  Are you willing to proceed?" STAFF: Did the patient verbally acknowledge consent to telehealth visit? Document  YES/NO here: YES  4. Advise patient to be prepared - "Two hours prior to your appointment, go ahead and check your blood pressure, pulse, oxygen saturation, and your weight (if you have the equipment to check those) and write them all down. When your visit starts, your provider will ask you for this information. If you have an Apple Watch or Kardia device, please plan to have heart rate information ready on the day of your appointment. Please have a pen and paper handy nearby the day of the visit as well." PATIENT DOES NOT HAVE BP MACHINE   5. Give patient instructions for MyChart download to smartphone OR Doximity/Doxy.me as below if video visit (depending on what platform provider is using)EXPLAINED PROCESS AND PT STATES HER UNDERSTANDING  6. Inform patient they will receive a phone call 15 minutes prior to their appointment time (may be from unknown caller ID) so they should be prepared to answer PT AWARE    TELEPHONE CALL NOTE  Lindsay Simmons [place] has been deemed a candidate for a follow-up tele-health visit to limit community exposure during the Covid-19 pandemic. I spoke with the patient via phone to ensure availability of phone/video source, confirm preferred email & phone number, and discuss instructions and expectations.  I reminded Lindsay Simmons [place] to be prepared with any vital sign and/or heart rhythm information that could potentially be obtained via home monitoring, at the time of  her visit. I reminded John Recendiz to expect a phone call prior to her visit.  Valrie Hart, CMA 03/28/2019 4:54 PM   INSTRUCTIONS FOR DOWNLOADING THE MYCHART APP TO SMARTPHONE  - The patient must first make sure to have activated MyChart and know their login information - If Apple, go to [employer] and type in MyChart in the search bar and download the app. If Android, ask patient to go to [employer] and type in [place] in the search bar and download the app. The app is free but as with any other app  downloads, their phone may require them to verify saved payment information or Apple/Android password.  - The patient will need to then log into the app with their MyChart username and password, and select  as their healthcare provider to link the account. When it is time for your visit, go to the MyChart app, find appointments, and click Begin Video Visit. Be sure to Select Allow for your device to access the Microphone and Camera for your visit. You will then be connected, and your provider will be with you shortly.  **If they have any issues connecting, or need assistance please contact MyChart service desk [phone])**  **If using a computer, in order to ensure the best quality for their visit they will need to use either of the following Internet Browsers: [employer] or Google Chrome**  IF USING DOXIMITY or DOXY.ME - The patient will receive a link just prior to their visit by text.     FULL LENGTH CONSENT FOR TELE-HEALTH VISIT   I hereby voluntarily request, consent and authorize [facility] and its employed or contracted physicians, physician assistants, nurse practitioners or other licensed health care professionals (the Practitioner), to provide me with telemedicine health care services (the "Services") as deemed necessary by the treating Practitioner. I acknowledge and consent to receive the Services by the Practitioner via telemedicine. I understand that the telemedicine visit will involve communicating with the Practitioner through live audiovisual communication technology and the disclosure of certain medical information by electronic transmission. I acknowledge that I have been given the opportunity to request an in-person assessment or other available alternative prior to the telemedicine visit and am voluntarily participating in the telemedicine visit.  I understand that I have the right to withhold or withdraw my consent to the use of telemedicine  in the course of my care at any time, without affecting my right to future care or treatment, and that the Practitioner or I may terminate the telemedicine visit at any time. I understand that I have the right to inspect all information obtained and/or recorded in the course of the telemedicine visit and may receive copies of available information for a reasonable fee.  I understand that some of the potential risks of receiving the Services via telemedicine include:  Marland Kitchen Delay or interruption in medical evaluation due to technological equipment failure or disruption; . Information transmitted may not be sufficient (e.g. poor resolution of images) to allow for appropriate medical decision making by the Practitioner; and/or  . In rare instances, security protocols could fail, causing a breach of personal health information.  Furthermore, I acknowledge that it is my responsibility to provide information about my medical history, conditions and care that is complete and accurate to the best of my ability. I acknowledge that Practitioner's advice, recommendations, and/or decision may be based on factors not within their control, such as incomplete or inaccurate data provided by me  or distortions of diagnostic images or specimens that may result from electronic transmissions. I understand that the practice of medicine is not an exact science and that Practitioner makes no warranties or guarantees regarding treatment outcomes. I acknowledge that I will receive a copy of this consent concurrently upon execution via email to the email address I last provided but may also request a printed copy by calling the office of [facility].    I understand that my insurance will be billed for this visit.   I have read or had this consent read to me. . I understand the contents of this consent, which adequately explains the benefits and risks of the Services being provided via telemedicine.  . I have been provided ample  opportunity to ask questions regarding this consent and the Services and have had my questions answered to my satisfaction. . I give my informed consent for the services to be provided through the use of telemedicine in my medical care  By participating in this telemedicine visit I agree to the above.

## 2019-03-28 NOTE — Telephone Encounter (Signed)
New Message ° ° ° °Pt is returning call  ° ° ° °Please call back  °

## 2019-03-30 ENCOUNTER — Telehealth (INDEPENDENT_AMBULATORY_CARE_PROVIDER_SITE_OTHER): Payer: BLUE CROSS/BLUE SHIELD | Admitting: Cardiology

## 2019-03-30 ENCOUNTER — Encounter: Payer: Self-pay | Admitting: Cardiology

## 2019-03-30 ENCOUNTER — Other Ambulatory Visit: Payer: Self-pay

## 2019-03-30 DIAGNOSIS — Z3A36 36 weeks gestation of pregnancy: Secondary | ICD-10-CM

## 2019-03-30 DIAGNOSIS — O99413 Diseases of the circulatory system complicating pregnancy, third trimester: Secondary | ICD-10-CM | POA: Diagnosis not present

## 2019-03-30 DIAGNOSIS — R011 Cardiac murmur, unspecified: Secondary | ICD-10-CM | POA: Diagnosis not present

## 2019-03-30 NOTE — Progress Notes (Signed)
Virtual Visit via Video Note   This visit type was conducted due to national recommendations for restrictions regarding the COVID-19 Pandemic (e.g. social distancing) in an effort to limit this patient's exposure and mitigate transmission in our community.  Due to her co-morbid illnesses, this patient is at least at moderate risk for complications without adequate follow up.  This format is felt to be most appropriate for this patient at this time.  All issues noted in this document were discussed and addressed.  A limited physical exam was performed with this format.  Please refer to the patient's chart for her consent to telehealth for [facility].   Evaluation Performed: Cardiology consult  This visit type was conducted due to national recommendations for restrictions regarding the COVID-19 Pandemic (e.g. social distancing).  This format is felt to be most appropriate for this patient at this time.  All issues noted in this document were discussed and addressed.  No physical exam was performed (except for noted visual exam findings with Video Visits).  Please refer to the patient's chart (MyChart message for video visits and phone note for telephone visits) for the patient's consent to telehealth for [facility].  Date:  03/30/2019   ID:  Lindsay Simmons, DOB 07-17-1996, MRN [id number]  Patient Location:  Home  Provider location:   [place]  PCP:  Raelyn Mora, CNM  Cardiologist:  New Electrophysiologist:  None   Chief Complaint:  Heart murmur  History of Present Illness:    Lindsay Simmons is a 23 y.o. female who presents via audio/video conferencing for a telehealth visit today in referral by Raelyn Mora certified nurse midwife for evaluation of heart murmur.  This is a 23 year old G1, P0 who is [redacted] weeks pregnant and was recently seen by the nurse midwife in February due to cervical pain.  At that time she was diagnosed with a heart murmur and now is referred for cardiac  evaluation.  She is here today for followup and is doing well.  She denies any chest pain or pressure, SOB, DOE, PND, orthopnea, LE edema, dizziness, palpitations or syncope. She is compliant with her meds and is tolerating meds with no SE.    The patient does not have symptoms concerning for COVID-19 infection (fever, chills, cough, or new shortness of breath).    Prior CV studies:   The following studies were reviewed today:  none  Past Medical History:  Diagnosis Date  . Anemia    Past Surgical History:  Procedure Laterality Date  . NO PAST SURGERIES       Current Meds  Medication Sig  . POLY-IRON 150 150 MG capsule TK 1 C PO QD  . Prenat-Fe Carbonyl-FA-Omega 3 (ONE-A-DAY WOMENS PRENATAL 1) 28-0.8-235 MG CAPS Take 1 capsule by mouth daily.  . progesterone (PROMETRIUM) 200 MG capsule Place one capsule vaginally at bedtime     Allergies:   Patient has no known allergies.   Social History   Tobacco Use  . Smoking status: Never Smoker  . Smokeless tobacco: Never Used  Substance Use Topics  . Alcohol use: Never    Frequency: Never  . Drug use: Never     Family Hx: The patient's family history is not on file.  ROS:   Please see the history of present illness.     All other systems reviewed and are negative.   Labs/Other Tests and Data Reviewed:    Recent Labs: 10/19/2018: Hemoglobin 10.7; Platelets 211   Recent Lipid Panel No  results found for: CHOL, TRIG, HDL, CHOLHDL, LDLCALC, LDLDIRECT  Wt Readings from Last 3 Encounters:  03/30/19 169 lb (76.7 kg)  03/21/19 167 lb (75.8 kg)  03/07/19 166 lb (75.3 kg)     Objective:    Vital Signs:  Ht 5\' 4"  (1.626 m)   Wt 169 lb (76.7 kg)   LMP 07/21/2018 (Exact Date)   BMI 29.01 kg/m    CONSTITUTIONAL:  Well nourished, well developed female in no acute distress.  EYES: anicteric MOUTH: oral mucosa is pink RESPIRATORY: Normal respiratory effort, symmetric expansion CARDIOVASCULAR: No peripheral edema SKIN:  No rash, lesions or ulcers MUSCULOSKELETAL: no digital cyanosis NEURO: Cranial Nerves II-XII grossly intact, moves all extremities PSYCH: Intact judgement and insight.  A&O x 3, Mood/affect appropriate   ASSESSMENT & PLAN:    1.  Heart murmur - since this is a telehealth visit and I cannot perform a auscultation of her heart to evaluate her heart murmur I will get a 2D echocardiogram to rule out structural heart disease.  She is completely asymptomatic from a cardiac standpoint.  2.  COVID-19 Education:The signs and symptoms of COVID-19 were discussed with the patient and how to seek care for testing (follow up with PCP or arrange E-visit).  The importance of social distancing was discussed today.  Patient Risk:   After full review of this patient's clinical status, I feel that they are at least moderate risk at this time.  Time:   Today, I have spent 10 minutes directly with the patient on video discussing medical problems including heart murmr.  We also reviewed the symptoms of COVID 19 and the ways to protect against contracting the virus with telehealth technology.  I spent an additional 5 minutes reviewing patient's chart including referral notes.  Medication Adjustments/Labs and Tests Ordered: Current medicines are reviewed at length with the patient today.  Concerns regarding medicines are outlined above.  Tests Ordered: No orders of the defined types were placed in this encounter.  Medication Changes: No orders of the defined types were placed in this encounter.   Disposition:  Follow up prn  Signed, Armanda Magicraci Myer Bohlman, MD  03/30/2019 1:32 PM    [facility]

## 2019-03-30 NOTE — Patient Instructions (Signed)
Medication Instructions:  Your physician recommends that you continue on your current medications as directed. Please refer to the Current Medication list given to you today.  If you need a refill on your cardiac medications before your next appointment, please call your pharmacy.   Lab work: None If you have labs (blood work) drawn today and your tests are completely normal, you will receive your results only by: Marland Kitchen MyChart Message (if you have MyChart) OR . A paper copy in the mail If you have any lab test that is abnormal or we need to change your treatment, we will call you to review the results.  Testing/Procedures: Your physician has requested that you have an echocardiogram, next week. Echocardiography is a painless test that uses sound waves to create images of your heart. It provides your doctor with information about the size and shape of your heart and how well your heart's chambers and valves are working. This procedure takes approximately one hour. There are no restrictions for this procedure.  Follow-Up: As needed, pending results.

## 2019-04-17 ENCOUNTER — Ambulatory Visit
Admission: EM | Admit: 2019-04-17 | Discharge: 2019-04-17 | Disposition: A | Discharge status: Home / Self Care | Payer: Medicaid Other | Attending: Emergency Medicine | Admitting: Emergency Medicine

## 2019-04-17 ENCOUNTER — Other Ambulatory Visit: Payer: Self-pay

## 2019-04-17 DIAGNOSIS — H6692 Otitis media, unspecified, left ear: Secondary | ICD-10-CM | POA: Diagnosis not present

## 2019-04-17 MED ORDER — AMOXICILLIN 500 MG PO CAPS: 500.0000 mg | ORAL_CAPSULE | Freq: Three times a day (TID) | ORAL | 0 refills | Status: DC

## 2019-04-17 NOTE — ED Provider Notes (Signed)
[facility]    CSN: [id number] Arrival date & time: 04/17/19  1330     History   Chief Complaint Chief Complaint  Patient presents with  . Otalgia    HPI Lindsay Simmons is a 23 y.o. female.   HPI Lindsay Simmons is a 23 y.o. female presenting to UC with c/o Left ear pain for 2 days. Pt was seen at a [facility] 2 days ago for Left ear fullness. She had her ear flushed but feels like water got trapped inside because it has been painful since then.  Denies cough, congestion, sore throat. Hx of ear infections in the past. Pain feels similar. No medication taken PTA.   Past Medical History:  Diagnosis Date  . Anemia     Patient Active Problem List   Diagnosis Date Noted  . Heart murmur 01/08/2019  . Supervision of normal first pregnancy, antepartum 10/19/2018    Past Surgical History:  Procedure Laterality Date  . NO PAST SURGERIES      OB History    Gravida  1   Para      Term      Preterm      AB      Living  0     SAB      TAB      Ectopic      Multiple      Live Births               Home Medications    Prior to Admission medications   Medication Sig Start Date End Date Taking? Authorizing Provider  amoxicillin (AMOXIL) 500 MG capsule Take 1 capsule (500 mg total) by mouth 3 (three) times daily. 04/17/19   Noe Gens, PA-C  POLY-IRON 150 150 MG capsule TK 1 C PO QD 03/01/19   [provider]  Prenat-Fe Carbonyl-FA-Omega 3 (ONE-A-DAY WOMENS PRENATAL 1) 28-0.8-235 MG CAPS Take 1 capsule by mouth daily. 10/19/18   Laury Deep, CNM  progesterone (PROMETRIUM) 200 MG capsule Place one capsule vaginally at bedtime 12/02/18   Constant, Peggy, MD    Family History No family history on file.  Social History Social History   Tobacco Use  . Smoking status: Never Smoker  . Smokeless tobacco: Never Used  Substance Use Topics  . Alcohol use: Never    Frequency: Never  . Drug use: Never     Allergies   Patient has  no known allergies.   Review of Systems Review of Systems  Constitutional: Negative for chills and fever.  HENT: Positive for ear pain (Left). Negative for congestion, sore throat, trouble swallowing and voice change.   Respiratory: Negative for cough and shortness of breath.   Cardiovascular: Negative for chest pain and palpitations.  Gastrointestinal: Negative for abdominal pain, diarrhea, nausea and vomiting.  Musculoskeletal: Negative for arthralgias, back pain and myalgias.  Skin: Negative for rash.     Physical Exam Triage Vital Signs ED Triage Vitals  Enc Vitals Group     BP 04/17/19 1339 120/77     Pulse Rate 04/17/19 1339 67     Resp 04/17/19 1339 16     Temp 04/17/19 1339 98.2 F (36.8 C)     Temp Source 04/17/19 1339 Oral     SpO2 04/17/19 1339 99 %     Weight --      Height --      Head Circumference --      Peak Flow --  Pain Score 04/17/19 1351 3     Pain Loc --      Pain Edu? --      Excl. in GC? --    No data found.  Updated Vital Signs BP 120/77 (BP Location: Left Arm)   Pulse 67   Temp 98.2 F (36.8 C) (Oral)   Resp 16   LMP 07/21/2018 (Exact Date)   SpO2 99%   Visual Acuity Right Eye Distance:   Left Eye Distance:   Bilateral Distance:    Right Eye Near:   Left Eye Near:    Bilateral Near:     Physical Exam Vitals signs and nursing note reviewed.  Constitutional:      Appearance: Normal appearance. She is well-developed.  HENT:     Head: Normocephalic and atraumatic.     Right Ear: Tympanic membrane normal.     Left Ear: Tenderness present. No swelling. A middle ear effusion is present. Tympanic membrane is erythematous. Tympanic membrane is not bulging.     Nose: Nose normal.     Right Sinus: No maxillary sinus tenderness or frontal sinus tenderness.     Left Sinus: No maxillary sinus tenderness or frontal sinus tenderness.     Mouth/Throat:     Lips: Pink.     Mouth: Mucous membranes are moist.     Pharynx: Oropharynx is  clear. Uvula midline.  Neck:     Musculoskeletal: Normal range of motion.  Cardiovascular:     Rate and Rhythm: Normal rate and regular rhythm.  Pulmonary:     Effort: Pulmonary effort is normal. No respiratory distress.     Breath sounds: Normal breath sounds.  Musculoskeletal: Normal range of motion.  Skin:    General: Skin is warm and dry.  Neurological:     Mental Status: She is alert and oriented to person, place, and time.  Psychiatric:        Behavior: Behavior normal.      UC Treatments / Results  Labs (all labs ordered are listed, but only abnormal results are displayed) Labs Reviewed - No data to display  EKG None  Radiology No results found.  Procedures Procedures (including critical care time)  Medications Ordered in UC Medications - No data to display  Initial Impression / Assessment and Plan / UC Course  I have reviewed the triage vital signs and the nursing notes.  Pertinent labs & imaging results that were available during my care of the patient were reviewed by me and considered in my medical decision making (see chart for details).     Hx and exam c/w Left AOM Will tx with amoxicillin Home care info provided  Final Clinical Impressions(s) / UC Diagnoses   Final diagnoses:  Left acute otitis media     Discharge Instructions      Please take antibiotics as prescribed and be sure to complete entire course even if you start to feel better to ensure infection does not come back.  You may take 500mg  acetaminophen every 4-6 hours or in combination with ibuprofen 400-600mg  every 6-8 hours as needed for pain, inflammation, and fever.  Be sure to well hydrated with clear liquids and get at least 8 hours of sleep at night, preferably more while sick.   Please follow up with family medicine in 1 week if needed.     ED Prescriptions    Medication Sig Dispense Auth. Provider   amoxicillin (AMOXIL) 500 MG capsule Take 1 capsule (500 mg total)  by mouth 3 (three) times daily. 21 capsule Lurene ShadowPhelps, Kayler Buckholtz O, PA-C     Controlled Substance Prescriptions [place] Controlled Substance Registry consulted? Not Applicable   Rolla Platehelps, Matilda Fleig O, PA-C 04/17/19 1418

## 2019-04-17 NOTE — ED Triage Notes (Signed)
Pt c/o lt ear pain x2days. [facility] and [facility] and still having pain.

## 2019-04-17 NOTE — Discharge Instructions (Signed)

## 2019-04-19 DIAGNOSIS — H6092 Unspecified otitis externa, left ear: Secondary | ICD-10-CM | POA: Diagnosis not present

## 2019-04-19 DIAGNOSIS — H6121 Impacted cerumen, right ear: Secondary | ICD-10-CM | POA: Diagnosis not present

## 2019-04-21 ENCOUNTER — Other Ambulatory Visit: Payer: Self-pay

## 2019-04-21 ENCOUNTER — Inpatient Hospital Stay (HOSPITAL_COMMUNITY)
Admission: RE | Admit: 2019-04-21 | Discharge: 2019-04-21 | Disposition: A | Discharge status: Home / Self Care | Payer: Medicaid Other | Source: Home / Self Care | Attending: Obstetrics and Gynecology | Admitting: Obstetrics and Gynecology

## 2019-04-21 ENCOUNTER — Encounter (HOSPITAL_COMMUNITY): Payer: Self-pay | Admitting: *Deleted

## 2019-04-21 DIAGNOSIS — O479 False labor, unspecified: Secondary | ICD-10-CM | POA: Insufficient documentation

## 2019-04-21 DIAGNOSIS — Z3A00 Weeks of gestation of pregnancy not specified: Secondary | ICD-10-CM | POA: Insufficient documentation

## 2019-04-21 DIAGNOSIS — Z3400 Encounter for supervision of normal first pregnancy, unspecified trimester: Secondary | ICD-10-CM

## 2019-04-21 DIAGNOSIS — Z3689 Encounter for other specified antenatal screening: Secondary | ICD-10-CM | POA: Insufficient documentation

## 2019-04-21 HISTORY — DX: Cardiac murmur, unspecified: R01.1

## 2019-04-21 NOTE — Discharge Instructions (Signed)

## 2019-04-21 NOTE — MAU Note (Signed)
Pt reports she has had ctx since last night about 4-7 min. Denies any vag bleeding or leaking. Good fetal movement felt.

## 2019-04-21 NOTE — MAU Provider Note (Signed)
Pt presents for RN labor eval.  Dilation: Fingertip Effacement (%): 80 Cervical Position: Posterior Station: -2 Presentation: Vertex Exam by:: K.Wilson,RN   Pt having ctx q4-22min on monitor and is not talking through them, per RN report to provider. Provider advised RN for pt to stay for an hour to be rechecked. Pt is a [facility] pt and does not desire to stay the hour. Pt called [facility] while in MAU and they agree she can be discharged. Pt desires discharge. Pt discharged to home.  EFM:       -baseline: 120       -variability: moderate       -accels: present, 15x15       -decels: single variable?       -TOCO: ctx q4-50min  Lindsay Simmons, Gerrie Nordmann, NP  9:26 AM 04/21/2019

## 2019-04-22 ENCOUNTER — Inpatient Hospital Stay (HOSPITAL_COMMUNITY)
Admission: AD | Admit: 2019-04-22 | Discharge: 2019-04-25 | DRG: 806 | Disposition: A | Discharge status: Home / Self Care | Payer: Medicaid Other | Attending: Certified Nurse Midwife | Admitting: Certified Nurse Midwife

## 2019-04-22 ENCOUNTER — Other Ambulatory Visit: Payer: Self-pay

## 2019-04-22 ENCOUNTER — Inpatient Hospital Stay (HOSPITAL_COMMUNITY): Payer: Medicaid Other | Admitting: Anesthesiology

## 2019-04-22 ENCOUNTER — Encounter (HOSPITAL_COMMUNITY): Payer: Self-pay | Admitting: *Deleted

## 2019-04-22 DIAGNOSIS — Z3A39 39 weeks gestation of pregnancy: Secondary | ICD-10-CM | POA: Diagnosis not present

## 2019-04-22 DIAGNOSIS — Z1159 Encounter for screening for other viral diseases: Secondary | ICD-10-CM

## 2019-04-22 DIAGNOSIS — O99824 Streptococcus B carrier state complicating childbirth: Principal | ICD-10-CM | POA: Diagnosis present

## 2019-04-22 DIAGNOSIS — O9952 Diseases of the respiratory system complicating childbirth: Secondary | ICD-10-CM | POA: Diagnosis present

## 2019-04-22 DIAGNOSIS — O9989 Other specified diseases and conditions complicating pregnancy, childbirth and the puerperium: Secondary | ICD-10-CM | POA: Diagnosis present

## 2019-04-22 DIAGNOSIS — O9081 Anemia of the puerperium: Secondary | ICD-10-CM | POA: Diagnosis not present

## 2019-04-22 DIAGNOSIS — J069 Acute upper respiratory infection, unspecified: Secondary | ICD-10-CM | POA: Diagnosis present

## 2019-04-22 DIAGNOSIS — D62 Acute posthemorrhagic anemia: Secondary | ICD-10-CM | POA: Diagnosis not present

## 2019-04-22 DIAGNOSIS — O26893 Other specified pregnancy related conditions, third trimester: Secondary | ICD-10-CM | POA: Diagnosis present

## 2019-04-22 DIAGNOSIS — H6690 Otitis media, unspecified, unspecified ear: Secondary | ICD-10-CM | POA: Diagnosis present

## 2019-04-22 DIAGNOSIS — Z3400 Encounter for supervision of normal first pregnancy, unspecified trimester: Secondary | ICD-10-CM

## 2019-04-22 DIAGNOSIS — O9902 Anemia complicating childbirth: Secondary | ICD-10-CM | POA: Diagnosis not present

## 2019-04-22 LAB — CBC
HCT: 33.2 % — ABNORMAL LOW (ref 36.0–46.0)
Hemoglobin: 10.7 g/dL — ABNORMAL LOW (ref 12.0–15.0)
MCH: 24.2 pg — ABNORMAL LOW (ref 26.0–34.0)
MCHC: 32.2 g/dL (ref 30.0–36.0)
MCV: 74.9 fL — ABNORMAL LOW (ref 80.0–100.0)
Platelets: 150 10*3/uL (ref 150–400)
RBC: 4.43 MIL/uL (ref 3.87–5.11)
RDW: 19.4 % — ABNORMAL HIGH (ref 11.5–15.5)
WBC: 10 10*3/uL (ref 4.0–10.5)
nRBC: 0 % (ref 0.0–0.2)

## 2019-04-22 LAB — SARS CORONAVIRUS 2 BY RT PCR (HOSPITAL ORDER, PERFORMED IN ~~LOC~~ HOSPITAL LAB): SARS Coronavirus 2: NEGATIVE

## 2019-04-22 MED ORDER — ACETAMINOPHEN 325 MG PO TABS
650.0000 mg | ORAL_TABLET | ORAL | Status: DC | PRN
  Administered 2019-04-23 (×2): 650 mg via ORAL
  Filled 2019-04-22 (×2): qty 2

## 2019-04-22 MED ORDER — LACTATED RINGERS IV SOLN
500.0000 mL | INTRAVENOUS | Status: DC | PRN
  Administered 2019-04-23: 500 mL via INTRAVENOUS

## 2019-04-22 MED ORDER — PHENYLEPHRINE 40 MCG/ML (10ML) SYRINGE FOR IV PUSH (FOR BLOOD PRESSURE SUPPORT): 80.0000 ug | PREFILLED_SYRINGE | INTRAVENOUS | Status: DC | PRN

## 2019-04-22 MED ORDER — EPHEDRINE 5 MG/ML INJ: 10.0000 mg | INTRAVENOUS | Status: DC | PRN

## 2019-04-22 MED ORDER — OXYTOCIN 40 UNITS IN NORMAL SALINE INFUSION - SIMPLE MED: 2.5000 [IU]/h | INTRAVENOUS | Status: DC

## 2019-04-22 MED ORDER — LIDOCAINE HCL (PF) 1 % IJ SOLN
INTRAMUSCULAR | Status: DC | PRN
  Administered 2019-04-22 (×2): 5 mL via EPIDURAL

## 2019-04-22 MED ORDER — FENTANYL-BUPIVACAINE-NACL 0.5-0.125-0.9 MG/250ML-% EP SOLN: 12.0000 mL/h | EPIDURAL | Status: DC | PRN

## 2019-04-22 MED ORDER — SOD CITRATE-CITRIC ACID 500-334 MG/5ML PO SOLN: 30.0000 mL | ORAL | Status: DC | PRN

## 2019-04-22 MED ORDER — SODIUM CHLORIDE (PF) 0.9 % IJ SOLN
INTRAMUSCULAR | Status: DC | PRN
  Administered 2019-04-22: 14 mL/h via EPIDURAL

## 2019-04-22 MED ORDER — PENICILLIN G 3 MILLION UNITS IVPB - SIMPLE MED
3.0000 10*6.[IU] | INTRAVENOUS | Status: DC
  Administered 2019-04-22 – 2019-04-23 (×5): 3 10*6.[IU] via INTRAVENOUS
  Filled 2019-04-22 (×5): qty 100

## 2019-04-22 MED ORDER — LACTATED RINGERS IV SOLN
INTRAVENOUS | Status: DC
  Administered 2019-04-22 – 2019-04-23 (×5): via INTRAVENOUS

## 2019-04-22 MED ORDER — SODIUM CHLORIDE 0.9 % IV SOLN
5.0000 10*6.[IU] | Freq: Once | INTRAVENOUS | Status: AC
  Administered 2019-04-22: 5 10*6.[IU] via INTRAVENOUS
  Filled 2019-04-22: qty 5

## 2019-04-22 MED ORDER — OXYCODONE-ACETAMINOPHEN 5-325 MG PO TABS: 1.0000 | ORAL_TABLET | ORAL | Status: DC | PRN

## 2019-04-22 MED ORDER — DIPHENHYDRAMINE HCL 50 MG/ML IJ SOLN: 12.5000 mg | INTRAMUSCULAR | Status: DC | PRN

## 2019-04-22 MED ORDER — FENTANYL-BUPIVACAINE-NACL 0.5-0.125-0.9 MG/250ML-% EP SOLN
12.0000 mL/h | EPIDURAL | Status: DC | PRN
  Administered 2019-04-23: 12 mL/h via EPIDURAL
  Filled 2019-04-22 (×2): qty 250

## 2019-04-22 MED ORDER — LACTATED RINGERS IV SOLN
500.0000 mL | Freq: Once | INTRAVENOUS | Status: AC
  Administered 2019-04-23: 500 mL via INTRAVENOUS

## 2019-04-22 MED ORDER — LACTATED RINGERS IV BOLUS
300.0000 mL | Freq: Once | INTRAVENOUS | Status: AC
  Administered 2019-04-22: 300 mL via INTRAVENOUS

## 2019-04-22 MED ORDER — LIDOCAINE HCL (PF) 1 % IJ SOLN: 30.0000 mL | INTRAMUSCULAR | Status: DC | PRN

## 2019-04-22 MED ORDER — ONDANSETRON HCL 4 MG/2ML IJ SOLN
4.0000 mg | Freq: Four times a day (QID) | INTRAMUSCULAR | Status: DC | PRN
  Administered 2019-04-22 – 2019-04-23 (×2): 4 mg via INTRAVENOUS
  Filled 2019-04-22 (×2): qty 2

## 2019-04-22 MED ORDER — OXYTOCIN BOLUS FROM INFUSION
500.0000 mL | Freq: Once | INTRAVENOUS | Status: AC
  Administered 2019-04-23: 500 mL via INTRAVENOUS

## 2019-04-22 MED ORDER — NALBUPHINE HCL 10 MG/ML IJ SOLN
10.0000 mg | Freq: Once | INTRAMUSCULAR | Status: AC
  Administered 2019-04-22: 10 mg via INTRAVENOUS
  Filled 2019-04-22: qty 1

## 2019-04-22 NOTE — H&P (Signed)
  OB ADMISSION/ HISTORY & PHYSICAL:  Admission Date: 04/22/2019  2:27 PM  Admit Diagnosis: 39.2 weeks prodromal labor  Lindsay Simmons is a 23 y.o. 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 Anemia   . Heart murmur     Past surgical history:  Past Surgical History:  Procedure Laterality Date  . NO PAST SURGERIES     Family History: History reviewed. No pertinent family history.  Social History:  reports that she has never smoked. She has never used smokeless tobacco. She reports that she does not drink alcohol or use drugs.  Allergies: Patient has no known allergies.   Current Medications at time of admission:  Prior to Admission medications   Medication Sig Start Date End Date Taking? Authorizing Provider  amoxicillin (AMOXIL) 500 MG capsule Take 1 capsule (500 mg total) by mouth 3 (three) times daily. 04/17/19   Noe Gens, PA-C  POLY-IRON 150 150 MG capsule TK 1 C PO QD 03/01/19   [provider]  Prenat-Fe Carbonyl-FA-Omega 3 (ONE-A-DAY WOMENS PRENATAL 1) 28-0.8-235 MG CAPS Take 1 capsule by mouth daily. 10/19/18   Laury Deep, CNM   Review of Systems: active FM onset of ctx x 24 hours  Physical  Exam: VS: Blood pressure 135/67, pulse 96, temperature 98.4 F (36.9 C), temperature source Oral, resp. rate 20, last menstrual period 07/21/2018, SpO2 100 %. alert and oriented, appears anxious and uncomfortable heart:RRR lung fields clear with ausculation abdomen soft and non-tender, non-distended  uterus gravid and non-tender / AGA extremities: no edema  cervical exam:Dilation: 2 Effacement (%): 90 Exam by:: Artelia Laroche CNM  vtx  / -1 station  FHR: baseline rate 130 / variability moderate / accelerations + / no decelerations TOCO: Q3-6 minutes  Assessment: [redacted] weeks gestation prodromal labor with fatigue and fear - no longer candidate for OOH birth FHR category 1  Plan:  Admit GBS prophylaxis IV therapeutic rest prior to augmentation  Dr Ronita Hipps notified of admission / plan of care  [place], MSN, [facility] 04/22/2019, 3:13 PM

## 2019-04-22 NOTE — Progress Notes (Signed)
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

## 2019-04-22 NOTE — Anesthesia Procedure Notes (Signed)
Epidural Patient location during procedure: OB  Staffing Anesthesiologist: Devory Mckinzie, MD Performed: anesthesiologist   Preanesthetic Checklist Completed: patient identified, site marked, surgical consent, pre-op evaluation, timeout performed, IV checked, risks and benefits discussed and monitors and equipment checked  Epidural Patient position: sitting Prep: DuraPrep Patient monitoring: heart rate, continuous pulse ox and blood pressure Approach: right paramedian Location: L3-L4 Injection technique: LOR saline  Needle:  Needle type: Tuohy  Needle gauge: 17 G Needle length: 9 cm and 9 Needle insertion depth: 6 cm Catheter type: closed end flexible Catheter size: 20 Guage Catheter at skin depth: 10 cm Test dose: negative  Assessment Events: blood not aspirated, injection not painful, no injection resistance, negative IV test and no paresthesia  Additional Notes Patient identified. Risks/Benefits/Options discussed with patient including but not limited to bleeding, infection, nerve damage, paralysis, failed block, incomplete pain control, headache, blood pressure changes, nausea, vomiting, reactions to medication both or allergic, itching and postpartum back pain. Confirmed with bedside nurse the patient's most recent platelet count. Confirmed with patient that they are not currently taking any anticoagulation, have any bleeding history or any family history of bleeding disorders. Patient expressed understanding and wished to proceed. All questions were answered. Sterile technique was used throughout the entire procedure. Please see nursing notes for vital signs. Test dose was given through epidural needle and negative prior to continuing to dose epidural or start infusion. Warning signs of high block given to the patient including shortness of breath, tingling/numbness in hands, complete motor block, or any concerning symptoms with instructions to call for help. Patient was given  instructions on fall risk and not to get out of bed. All questions and concerns addressed with instructions to call with any issues.     

## 2019-04-22 NOTE — Anesthesia Preprocedure Evaluation (Signed)

## 2019-04-22 NOTE — MAU Note (Signed)
Lindsay Simmons is a 23 y.o. at [redacted]w[redacted]d here in MAU reporting: has been contracting since Thursday, now they are about every 4 min. States she was here yesterday but the pain is much worse today. States she has had some bloody mucus, no LOF, +FM  Onset of complaint: ongoing  Pain score: 9/10  Vitals:   04/22/19 1438  BP: 135/67  Pulse: 96  Resp: 20  Temp: 98.4 F (36.9 C)  SpO2: 100%     FHT: +FM  Lab orders placed from triage: none

## 2019-04-23 ENCOUNTER — Encounter (HOSPITAL_COMMUNITY): Payer: Self-pay | Admitting: General Practice

## 2019-04-23 LAB — CBC
HCT: 29.2 % — ABNORMAL LOW (ref 36.0–46.0)
Hemoglobin: 9.3 g/dL — ABNORMAL LOW (ref 12.0–15.0)
MCH: 24.1 pg — ABNORMAL LOW (ref 26.0–34.0)
MCHC: 31.8 g/dL (ref 30.0–36.0)
MCV: 75.6 fL — ABNORMAL LOW (ref 80.0–100.0)
Platelets: 123 10*3/uL — ABNORMAL LOW (ref 150–400)
RBC: 3.86 MIL/uL — ABNORMAL LOW (ref 3.87–5.11)
RDW: 19.7 % — ABNORMAL HIGH (ref 11.5–15.5)
WBC: 10.5 10*3/uL (ref 4.0–10.5)
nRBC: 0 % (ref 0.0–0.2)

## 2019-04-23 LAB — ABO/RH: ABO/RH(D): O POS

## 2019-04-23 LAB — RPR: RPR Ser Ql: NONREACTIVE

## 2019-04-23 MED ORDER — ACETAMINOPHEN 325 MG PO TABS: 650.0000 mg | ORAL_TABLET | ORAL | Status: DC | PRN

## 2019-04-23 MED ORDER — COCONUT OIL OIL: 1.0000 "application " | TOPICAL_OIL | Status: DC | PRN

## 2019-04-23 MED ORDER — TRANEXAMIC ACID-NACL 1000-0.7 MG/100ML-% IV SOLN
1000.0000 mg | INTRAVENOUS | Status: AC
  Administered 2019-04-23: 1000 mg via INTRAVENOUS

## 2019-04-23 MED ORDER — BENZOCAINE-MENTHOL 20-0.5 % EX AERO: 1.0000 "application " | INHALATION_SPRAY | CUTANEOUS | Status: DC | PRN

## 2019-04-23 MED ORDER — TRANEXAMIC ACID-NACL 1000-0.7 MG/100ML-% IV SOLN
INTRAVENOUS | Status: AC
  Filled 2019-04-23: qty 100

## 2019-04-23 MED ORDER — LACTATED RINGERS IV BOLUS
500.0000 mL | Freq: Once | INTRAVENOUS | Status: AC
  Administered 2019-04-23: 500 mL via INTRAVENOUS

## 2019-04-23 MED ORDER — IBUPROFEN 600 MG PO TABS
600.0000 mg | ORAL_TABLET | Freq: Four times a day (QID) | ORAL | Status: DC
  Administered 2019-04-23 – 2019-04-25 (×7): 600 mg via ORAL
  Filled 2019-04-23 (×7): qty 1

## 2019-04-23 MED ORDER — TERBUTALINE SULFATE 1 MG/ML IJ SOLN: 0.2500 mg | Freq: Once | INTRAMUSCULAR | Status: DC | PRN

## 2019-04-23 MED ORDER — WITCH HAZEL-GLYCERIN EX PADS: 1.0000 "application " | MEDICATED_PAD | CUTANEOUS | Status: DC | PRN

## 2019-04-23 MED ORDER — MAGNESIUM OXIDE 400 (241.3 MG) MG PO TABS
400.0000 mg | ORAL_TABLET | Freq: Every day | ORAL | Status: DC
  Administered 2019-04-24 – 2019-04-25 (×2): 400 mg via ORAL
  Filled 2019-04-23 (×2): qty 1

## 2019-04-23 MED ORDER — POLYSACCHARIDE IRON COMPLEX 150 MG PO CAPS
150.0000 mg | ORAL_CAPSULE | Freq: Every day | ORAL | Status: DC
  Administered 2019-04-24 – 2019-04-25 (×2): 150 mg via ORAL
  Filled 2019-04-23 (×2): qty 1

## 2019-04-23 MED ORDER — LIDOCAINE-EPINEPHRINE (PF) 2 %-1:200000 IJ SOLN
INTRAMUSCULAR | Status: DC | PRN
  Administered 2019-04-23: 5 mL via EPIDURAL

## 2019-04-23 MED ORDER — MISOPROSTOL 200 MCG PO TABS
800.0000 ug | ORAL_TABLET | Freq: Once | ORAL | Status: AC
  Administered 2019-04-23: 800 ug via RECTAL

## 2019-04-23 MED ORDER — LACTATED RINGERS IV SOLN
INTRAVENOUS | Status: DC
  Administered 2019-04-23: via INTRAVENOUS

## 2019-04-23 MED ORDER — CEFAZOLIN SODIUM-DEXTROSE 2-4 GM/100ML-% IV SOLN
2.0000 g | Freq: Once | INTRAVENOUS | Status: AC
  Administered 2019-04-23: 2 g via INTRAVENOUS
  Filled 2019-04-23: qty 100

## 2019-04-23 MED ORDER — OXYTOCIN 40 UNITS IN NORMAL SALINE INFUSION - SIMPLE MED
1.0000 m[IU]/min | INTRAVENOUS | Status: DC
  Administered 2019-04-23: 2 m[IU]/min via INTRAVENOUS
  Filled 2019-04-23: qty 1000

## 2019-04-23 MED ORDER — SENNOSIDES-DOCUSATE SODIUM 8.6-50 MG PO TABS
2.0000 | ORAL_TABLET | ORAL | Status: DC
  Administered 2019-04-23 – 2019-04-24 (×2): 2 via ORAL
  Filled 2019-04-23 (×2): qty 2

## 2019-04-23 MED ORDER — MISOPROSTOL 200 MCG PO TABS
ORAL_TABLET | ORAL | Status: AC
  Filled 2019-04-23: qty 4

## 2019-04-23 MED ORDER — OXYCODONE HCL 5 MG PO TABS: 5.0000 mg | ORAL_TABLET | ORAL | Status: DC | PRN

## 2019-04-23 MED ORDER — METHYLERGONOVINE MALEATE 0.2 MG/ML IJ SOLN
INTRAMUSCULAR | Status: AC
  Filled 2019-04-23: qty 1

## 2019-04-23 MED ORDER — DIBUCAINE (PERIANAL) 1 % EX OINT: 1.0000 "application " | TOPICAL_OINTMENT | CUTANEOUS | Status: DC | PRN

## 2019-04-23 MED ORDER — PRENATAL MULTIVITAMIN CH
1.0000 | ORAL_TABLET | Freq: Every day | ORAL | Status: DC
  Administered 2019-04-24 – 2019-04-25 (×2): 1 via ORAL
  Filled 2019-04-23 (×2): qty 1

## 2019-04-23 MED ORDER — SIMETHICONE 80 MG PO CHEW: 80.0000 mg | CHEWABLE_TABLET | ORAL | Status: DC | PRN

## 2019-04-23 MED ORDER — METHYLERGONOVINE MALEATE 0.2 MG/ML IJ SOLN
0.2000 mg | Freq: Once | INTRAMUSCULAR | Status: AC
  Administered 2019-04-23: 0.2 mg via INTRAMUSCULAR

## 2019-04-23 MED ORDER — OXYTOCIN 40 UNITS IN NORMAL SALINE INFUSION - SIMPLE MED: 2.5000 [IU]/h | INTRAVENOUS | Status: AC

## 2019-04-23 NOTE — Progress Notes (Signed)
TC from nurse @1650  prolonged FHR decel to 90s > 5 minutes with slow recovery to baseline  Provider on unit to evaluate @ 1700 FHR baseline 140 moderate variability / 10x10 accels / variable decels Toco Q 2-4 / pitocin off Cervix 10cm / 100% / vtx +2 caput / ?ROP but difficult to identify suture lines with large caput  Reports pressure with ctx - comfortable with epidural Foley - bloody urine / removed  Start active second stage Anticipate SVB  [place] 04/23/2019 @ 1700 PM

## 2019-04-23 NOTE — Progress Notes (Signed)
Patient ID: Lindsay Simmons, female   DOB: 1995-11-06, 23 y.o.   MRN: [id number] Called to assess PP bleeding. Placenta complete, no cervical lacerations noted per CNM BP 100/64   Pulse (!) 114   Temp 99.6 F (37.6 C) (Oral)   Resp 18   Ht 5\' 4"  (1.626 m)   Wt 84.4 kg   LMP 07/21/2018 (Exact Date)   SpO2 100%   Breastfeeding Unknown   BMI 31.93 kg/m   50cc clot evacuated from LUS. Fundus explored, No retained placenta noted. Minimal bleeding noted after evacuation. Fundus firm. TXA given. Will give one dose Ancef IV.

## 2019-04-23 NOTE — Progress Notes (Signed)
Subjective:   No pressure or pain  Objective:   VS: BP 124/75   Pulse 68   Temp 99.4 F (37.4 C) (Oral)   Resp 15   Ht 5\' 4"  (1.626 m)   Wt 84.4 kg   LMP 07/21/2018 (Exact Date)   SpO2 98%   BMI 31.93 kg/m  FHR : baseline 125 / variability moderate / accelerations + / no decelerations Toco: contractions every 2-6 minutes / mild   cervix : 6/90 vtx 0  membranes: clear fluid with small brown mucus  foley: dark amber urine   Assessment /Plan:  Prolonged  labor FHR category 1 GBS on prophylaxis Inadequate ctx Mild dehydration  IV fluid bolus Pitocin low dose protocol Reposition for fetal rotation and descent  Re-assess in 2-4 hour for progress   Artelia Laroche CNM, MSN, [facility] 04/23/2019, 9:18 AM

## 2019-04-23 NOTE — Progress Notes (Signed)
Subjective:   Some pressure - no pain  Objective:   VS: BP (!) 119/52   Pulse 74   Temp 99.3 F (37.4 C) (Oral)   Resp 15   Ht 5\' 4"  (1.626 m)   Wt 84.4 kg   LMP 07/21/2018 (Exact Date)   SpO2 98%   BMI 31.93 kg/m  FHR : baseline 125 / variability moderate / accelerations + / no decelerations Toco: contractions every 2-4 minutes / pitocin 12 mu/min  cervix : deferred (nurse exam no change @ noon)  membranes: clear with show  foley tubing - clear urine  Assessment /Plan:  active labor - protracted OP lie FHR category 1  repositioned lateral RIGHT with peanut recheck in 2 hours for progress - consult if no progression   Artelia Laroche CNM, MSN, [facility] 04/23/2019, 1:13 PM

## 2019-04-24 ENCOUNTER — Telehealth (HOSPITAL_COMMUNITY): Payer: Self-pay

## 2019-04-24 LAB — CBC
HCT: 20.8 % — ABNORMAL LOW (ref 36.0–46.0)
HCT: 27.4 % — ABNORMAL LOW (ref 36.0–46.0)
Hemoglobin: 6.8 g/dL — CL (ref 12.0–15.0)
Hemoglobin: 9 g/dL — ABNORMAL LOW (ref 12.0–15.0)
MCH: 24.6 pg — ABNORMAL LOW (ref 26.0–34.0)
MCH: 25.5 pg — ABNORMAL LOW (ref 26.0–34.0)
MCHC: 32.7 g/dL (ref 30.0–36.0)
MCHC: 32.8 g/dL (ref 30.0–36.0)
MCV: 75.4 fL — ABNORMAL LOW (ref 80.0–100.0)
MCV: 77.6 fL — ABNORMAL LOW (ref 80.0–100.0)
Platelets: 103 10*3/uL — ABNORMAL LOW (ref 150–400)
Platelets: 108 10*3/uL — ABNORMAL LOW (ref 150–400)
RBC: 2.76 MIL/uL — ABNORMAL LOW (ref 3.87–5.11)
RBC: 3.53 MIL/uL — ABNORMAL LOW (ref 3.87–5.11)
RDW: 19.4 % — ABNORMAL HIGH (ref 11.5–15.5)
RDW: 18.5 % — ABNORMAL HIGH (ref 11.5–15.5)
WBC: 11.1 10*3/uL — ABNORMAL HIGH (ref 4.0–10.5)
WBC: 12.6 10*3/uL — ABNORMAL HIGH (ref 4.0–10.5)
nRBC: 0 % (ref 0.0–0.2)
nRBC: 0 % (ref 0.0–0.2)

## 2019-04-24 LAB — PREPARE RBC (CROSSMATCH)

## 2019-04-24 MED ORDER — SODIUM CHLORIDE 0.9% IV SOLUTION
Freq: Once | INTRAVENOUS | Status: AC
  Administered 2019-04-24: 10:00:00 via INTRAVENOUS

## 2019-04-24 MED ORDER — DIPHENHYDRAMINE HCL 25 MG PO CAPS
25.0000 mg | ORAL_CAPSULE | Freq: Once | ORAL | Status: AC
  Administered 2019-04-24: 25 mg via ORAL
  Filled 2019-04-24: qty 1

## 2019-04-24 MED ORDER — ACETAMINOPHEN 325 MG PO TABS
650.0000 mg | ORAL_TABLET | Freq: Once | ORAL | Status: AC
  Administered 2019-04-24: 650 mg via ORAL
  Filled 2019-04-24: qty 2

## 2019-04-24 NOTE — Anesthesia Postprocedure Evaluation (Signed)
Anesthesia Post Note  Patient: Lindsay Simmons  Procedure(s) Performed: AN AD [place]     Patient location during evaluation: Mother Baby Anesthesia Type: Epidural Level of consciousness: awake and alert Pain management: pain level controlled Vital Signs Assessment: post-procedure vital signs reviewed and stable Respiratory status: spontaneous breathing, nonlabored ventilation and respiratory function stable Cardiovascular status: stable Postop Assessment: no headache, no backache and epidural receding Anesthetic complications: no Comments: Spoke with pt on the phone. Pt had no complaints.    Last Vitals:  Vitals:   04/24/19 0120 04/24/19 0546  BP: 130/70 135/64  Pulse: (!) 118 81  Resp: 18 18  Temp: 37.9 C 37.2 C  SpO2:      Last Pain:  Vitals:   04/24/19 0546  TempSrc: Oral  PainSc: 1    Pain Goal:                   Riki Sheer

## 2019-04-24 NOTE — Telephone Encounter (Signed)
Error

## 2019-04-24 NOTE — Progress Notes (Signed)
PPD 1 SVD with PPH due to retained POC  Subjective:   reports feeling tired and weak - "ok" / reports ear pain much better tolerating po intake without nausea or vomiting vaginal bleeding reported as moderate pain controlled with  Motrin Not OOB yet  Newborn Breast   Objective:   VS: BP 135/64 (BP Location: Left Arm)   Pulse 81   Temp 98.9 F (37.2 C) (Oral)   Resp 18   Ht 5\' 4"  (1.626 m)   Wt 84.4 kg   LMP 07/21/2018 (Exact Date)   SpO2 100%   Breastfeeding Unknown   BMI 31.93 kg/m   Pulse:112 - 114 -93 - 118 BP range: 95-135/ 40-64  LABS:  Recent Labs    04/23/19 1747 04/24/19 0449  WBC 10.5 11.1*  HGB 9.3* 6.8*  PLT 123* 103*   Blood type: O pos Rubella: 1.48 (12/18 1502)   Immune              I&O: Intake/Output      06/21 0701 - 06/22 0700 06/22 0701 - 06/23 0700   Urine (mL/kg/hr) 2325 (1.1)    Blood 1639    Total Output 3964    Net -3964          Physical Exam: alert and oriented X3 without any distress or pain right ear with dried bloody drainage  (dx otitis prior to admit - 6/15 start ABX course) abdomen soft, non-tender, non-distended  uterine fundus firm, non-tender, Ueven perineum intact / mild edema lochia moderate extremities: no edema, no calf pain or tenderness   Assessment / Plan:  PPD # 1 SVD -intact PPH due to retained membranes / tissue A-thal / anemia with compounded blood loss at delivery URI with acute otitis (dx 6/15 on ABX course)  stable status - no further heavy bleeding during night - moderate bleeding with pericare routine post partum orders foley out this am orthostatic VS this am prior to ambulation consult with Dr Ronita Hipps r/t hgb & management plan a-thal carrier       - transfusion best option for severe anemia management       - likely limited 1-unit for transfusion due to Covid order to 2 units to attempt hgb goal of 8.0 - aware likely will only receive 1 unit due to [place], MSN, [facility] 04/24/2019,  7:08 AM

## 2019-04-24 NOTE — Lactation Note (Signed)
This note was copied from a baby's chart. Lactation Consultation Note  Patient Name: Lindsay Simmons [id number] Date: 04/24/2019 Reason for consult: Initial assessment;Term;1st time breastfeeding;Primapara;Infant weight loss  21 hours old FT female who is being exclusively BF by his mother, she's a P1. LC showed mom how to hand express and she was able to get colostrum very easily. She also reported (+) breast changes during the pregnancy, mom couldn't attend to any BF classes due to the virus. She has a [employer] DEBP at home.  Baby already nursing when entering the room, but noticed that the latch was shallow, mom stated if felt like baby was pinching; baby was wearing clothes. LC showed mom how to break the latch and suggested doing STS and mom agreed. LC took baby STS to mother's right breast and he was able to latch after a few tries. Showed mom the key pointers for a deep asymmetrical latch; a few audible swallows were noted. Baby still nursing when exiting the room at the 10 minutes mark. Reviewed normal newborn behavior, cluster feeding and feeding cues.  Feeding plan:  1. Encouraged mom to feed baby STS 8-12 times/24 hours or sooner if feeding cues are present 2. Hand expression and spoon feeding were also encouraged  BF brochure, BF resources and feeding diary were also reviewed. Parents reported all questions and concerns were answered, they're both aware of [facility] OP services and will call PRN.  Maternal Data Formula Feeding for Exclusion: No Has patient been taught Hand Expression?: Yes Does the patient have breastfeeding experience prior to this delivery?: No  Feeding Feeding Type: Breast Fed  LATCH Score Latch: Repeated attempts needed to sustain latch, nipple held in mouth throughout feeding, stimulation needed to elicit sucking reflex.  Audible Swallowing: A few with stimulation  Type of Nipple: Everted at rest and after stimulation  Comfort (Breast/Nipple): Soft /  non-tender  Hold (Positioning): Assistance needed to correctly position infant at breast and maintain latch.  LATCH Score: 7  Interventions Interventions: Breast feeding basics reviewed;Assisted with latch;Skin to skin;Breast massage;Hand express;Breast compression;Adjust position;Support pillows  Lactation Tools Discussed/Used WIC Program: No   Consult Status Consult Status: Follow-up Date: 04/25/19 Follow-up type: In-patient    Julieta Rogalski Francene Boyers 04/24/2019, 2:19 PM

## 2019-04-25 ENCOUNTER — Telehealth (HOSPITAL_COMMUNITY): Payer: Self-pay | Admitting: Radiology

## 2019-04-25 ENCOUNTER — Other Ambulatory Visit (HOSPITAL_COMMUNITY): Payer: Medicaid Other

## 2019-04-25 LAB — TYPE AND SCREEN
ABO/RH(D): O POS
Antibody Screen: NEGATIVE
Unit division: 0
Unit division: 0

## 2019-04-25 LAB — BPAM RBC
Blood Product Expiration Date: 202007182359
Blood Product Expiration Date: 202007182359
ISSUE DATE / TIME: 202006221031
ISSUE DATE / TIME: 202006221308
Unit Type and Rh: 5100
Unit Type and Rh: 5100

## 2019-04-25 MED ORDER — MAGNESIUM OXIDE 400 (241.3 MG) MG PO TABS: 400.0000 mg | ORAL_TABLET | Freq: Every day | ORAL | 0 refills | Status: DC

## 2019-04-25 MED ORDER — IBUPROFEN 600 MG PO TABS: 600.0000 mg | ORAL_TABLET | Freq: Four times a day (QID) | ORAL | 0 refills | Status: DC

## 2019-04-25 NOTE — Progress Notes (Signed)
PPD 2 SVD with PPH due to retained membranes / tissue  Subjective:   reports feeling good - no weakness or dizziness / no pain / earache much better tolerating po intake without nausea or vomiting vaginal bleeding reported as light pain controlled with Motrin up ad lib /  voiding QS without urinary concerns  Newborn Breast  Objective:   VS: BP 131/76 (BP Location: Left Arm)   Pulse 66   Temp 97.8 F (36.6 C) (Oral)   Resp 18   Ht 5\' 4"  (1.626 m)   Wt 84.4 kg   LMP 07/21/2018 (Exact Date)   SpO2 99%   Breastfeeding Unknown   BMI 31.93 kg/m   LABS:  Recent Labs    04/24/19 0449 04/24/19 2103  WBC 11.1* 12.6*  HGB 6.8* 9.0*  PLT 103* 108*   Physical Exam: alert and oriented X3 without any distress or pai abdomen soft, non-tender, non-distended  uterine fundus firm, non-tender, Ueven perineum intact lochia light extremities: no edema, no calf pain or tenderness   Assessment / Plan:  PPD # 2  IDA with compounded ABL  doing well - stable status routine post partum orders donor milk for supplement until milk fully in / may be delayed due to severe anemia with blood loss follow-up 2 weeks at [place], MSN, [facility] 04/25/2019, 12:24 PM

## 2019-04-25 NOTE — Telephone Encounter (Signed)
Patient was hospitalized at the time of her echocardiogram appointment. Called hospital to see if patient could have echocardiogram while in hospital. I was told by inpatient echo lab, I would have to call the patient and have her hospital physician order the echo as an inpatient. I spoke with patient, she is getting ready to be discharged. She will have the echocardiogram as an outpatient. She will call the office to reschedule appointment.

## 2019-04-25 NOTE — Discharge Summary (Signed)
Obstetric Discharge Summary Reason for Admission: onset of labor Prenatal Procedures: none Intrapartum Procedures: spontaneous vaginal delivery, uterine exploration, GBS prophylaxis and epidural Postpartum Procedures: antibiotics Complications-Operative and Postpartum: hemorrhage from retained membranes and tissue Hemoglobin  Date Value Ref Range Status  04/24/2019 9.0 (L) 12.0 - 15.0 g/dL Final    Comment:    REPEATED TO VERIFY POST TRANSFUSION SPECIMEN   10/19/2018 10.7 (L) 11.1 - 15.9 g/dL Final   HCT  Date Value Ref Range Status  04/24/2019 27.4 (L) 36.0 - 46.0 % Final   Hematocrit  Date Value Ref Range Status  10/19/2018 32.5 (L) 34.0 - 46.6 % Final   HGB nadir 6.8  Physical Exam:  General: alert, cooperative and no distress Lochia: appropriate Uterine Fundus: firm Incision: healing well DVT Evaluation: No evidence of DVT seen on physical exam.  Discharge Diagnoses: Term Pregnancy-delivered and Anemia -stable  Discharge Information: Date: 04/25/2019 Activity: pelvic rest Diet: routine Medications: PNV, Ibuprofen, Iron and magnesium Condition: stable Instructions:  call [facility] if any fever, abdominal pain, bleeding increase, breastfeeding issues or concerns water intake daily - 6 bottles of water take prenatal and magnesium and iron daily pump and/or breastfeed every 2-4 hours - do not go longer than 4 hours between feedings  Use donor milk as needed for feeding supplement until breast milk established     keep frozen for use as needed      defrost in frig x 24 hours 2 packs for use      can rapid defrost in warm water on counter      DO NOT MICROWAVE     do not re-use breast milk - throw breast milk away that is left after a feeding     donor milk is good in freezer for 6 months - use as needed (throw away after 6 months if not used)  Discharge to: home Follow-up Information    Lindsay Simmons, CNM. Schedule an appointment as soon as possible for a visit in 2  week(s).   Specialty: Obstetrics and Gynecology Why: Call [facility] for any questions or concerns prior to ER visits Call [facility] if any breastfeeding issues for lactation services Contact information: [address] [place] [address] [phone]          Newborn Data: Live born female  Birth Weight: 7 lb 13.6 oz (3560 g) APGAR: 35, 9  Newborn Delivery   Birth date/time: 04/23/2019 16:52:00 Delivery type: Vaginal, Spontaneous     Home with mother.  Lindsay Simmons 04/25/2019, 12:37 PM

## 2019-04-25 NOTE — Lactation Note (Signed)
This note was copied from a baby's chart. Lactation Consultation Note  Patient Name: Lindsay Simmons [id number] Date: 04/25/2019 Reason for consult: Follow-up assessment;Primapara;Term Baby is 39 hours/1% weight loss.  Mom reports that baby is latching and feeding well.  Some cluster feeding.  Nipples tender.  Discussed milk coming to volume and the prevention and treatment of engorgement.  Mom has a DEBP at home.  Questions answered.  Reviewed outpatient lactation support and services.  Encouraged to call prn.  Maternal Data    Feeding Feeding Type: Breast Fed  LATCH Score                   Interventions    Lactation Tools Discussed/Used     Consult Status Consult Status: Complete Follow-up type: Call as needed    Ave Filter 04/25/2019, 8:44 AM

## 2019-06-04 ENCOUNTER — Inpatient Hospital Stay (HOSPITAL_COMMUNITY)
Admission: EM | Admit: 2019-06-04 | Discharge: 2019-06-04 | Disposition: A | Discharge status: Home / Self Care | Payer: Medicaid Other | Attending: Obstetrics & Gynecology | Admitting: Obstetrics & Gynecology

## 2019-06-04 ENCOUNTER — Other Ambulatory Visit: Payer: Self-pay

## 2019-06-04 DIAGNOSIS — N939 Abnormal uterine and vaginal bleeding, unspecified: Secondary | ICD-10-CM

## 2019-06-04 DIAGNOSIS — O9089 Other complications of the puerperium, not elsewhere classified: Secondary | ICD-10-CM | POA: Diagnosis not present

## 2019-06-04 LAB — CBC
HCT: 37.7 % (ref 36.0–46.0)
Hemoglobin: 12.2 g/dL (ref 12.0–15.0)
MCH: 24.9 pg — ABNORMAL LOW (ref 26.0–34.0)
MCHC: 32.4 g/dL (ref 30.0–36.0)
MCV: 77.1 fL — ABNORMAL LOW (ref 80.0–100.0)
Platelets: 196 10*3/uL (ref 150–400)
RBC: 4.89 MIL/uL (ref 3.87–5.11)
RDW: 15.6 % — ABNORMAL HIGH (ref 11.5–15.5)
WBC: 8.4 10*3/uL (ref 4.0–10.5)
nRBC: 0 % (ref 0.0–0.2)

## 2019-06-04 NOTE — MAU Note (Signed)
Pt delivered SVD on 21 June 20 and bled normally for about 3 1/2 - 4 weeks. She had one week of clear discharge and then started bleeding again last week. She was concerned bc she had a transfusion immediately  post partum X2 units. Denies any abd cramping with the bleeding. Pt is breastfeeding baby.

## 2019-06-04 NOTE — MAU Provider Note (Signed)
History     CSN: [id number]  Arrival date and time: 06/04/19 2209   First Provider Initiated Contact with Patient 06/04/19 2302      Chief Complaint  Patient presents with  . Vaginal Bleeding   Lindsay Simmons is a 23 y.o. G1P1 who is 6 weeks PP from a SVD on 6/21. She presents to MAU with complaints of vaginal bleeding. Reports she bled after delivery for 3-4 weeks, stopped bleeding then started bleeding again last week. She describes the bleeding as light pink spotting where she has to wear a panty liner. Denies having to wear a pad or passing clots. She denies abdominal pain or cramping. Was concerned with bleeding d/t having a PPH after delivery and needing 2 unit of blood. She plans on receiving PP care with [facility], plans to call in morning to schedule appointment. She is exclusively breastfeeding and plans for POPs for contraception.    OB History    Gravida  1   Para  1   Term  1   Preterm      AB      Living  1     SAB      TAB      Ectopic      Multiple  0   Live Births  1           Past Medical History:  Diagnosis Date  . Anemia   . Heart murmur     Past Surgical History:  Procedure Laterality Date  . NO PAST SURGERIES      No family history on file.  Social History   Tobacco Use  . Smoking status: Never Smoker  . Smokeless tobacco: Never Used  Substance Use Topics  . Alcohol use: Never    Frequency: Never  . Drug use: Never    Allergies: No Known Allergies  Medications Prior to Admission  Medication Sig Dispense Refill Last Dose  . magnesium oxide (MAG-OX) 400 (241.3 Mg) MG tablet Take 1 tablet (400 mg total) by mouth daily. 90 tablet 0 Past Month at Unknown time  . POLY-IRON 150 150 MG capsule TK 1 C PO QD   Past Month at Unknown time  . Prenat-Fe Carbonyl-FA-Omega 3 (ONE-A-DAY WOMENS PRENATAL 1) 28-0.8-235 MG CAPS Take 1 capsule by mouth daily. 30 capsule 12 06/03/2019 at Unknown time  . amoxicillin (AMOXIL) 500 MG capsule Take 1  capsule (500 mg total) by mouth 3 (three) times daily. 21 capsule 0 More than a month at Unknown time  . ibuprofen (ADVIL) 600 MG tablet Take 1 tablet (600 mg total) by mouth every 6 (six) hours. 30 tablet 0 More than a month at Unknown time    Review of Systems  Constitutional: Negative.   Respiratory: Negative.   Cardiovascular: Negative.   Gastrointestinal: Negative.   Genitourinary: Positive for vaginal bleeding. Negative for difficulty urinating, dysuria, frequency, hematuria, pelvic pain and urgency.  Musculoskeletal: Negative.   Neurological: Negative.    Physical Exam   Blood pressure 121/67, pulse 87, temperature 98 F (36.7 C), temperature source Oral, resp. rate 16, height 5\' 4"  (1.626 m), weight 68.5 kg, last menstrual period 05/29/2019, currently breastfeeding.  Physical Exam  Nursing note and vitals reviewed. Constitutional: She is oriented to person, place, and time. She appears well-developed and well-nourished. No distress.  Cardiovascular: Normal rate and regular rhythm.  Respiratory: Effort normal and breath sounds normal. No respiratory distress. She has no wheezes. She has no rales.  GI: Soft. Bowel  sounds are normal. She exhibits no distension. There is no abdominal tenderness. There is no rebound and no guarding.  Genitourinary:    Vaginal bleeding present.  There is bleeding in the vagina.    Genitourinary Comments: Pelvic exam: Cervix pink, visually closed, without lesion, scant light pink vaginal bleeding present without clots (1 faux swab), vaginal walls and external genitalia normal Bimanual exam: Cervix 0/long/high, firm, anterior, neg CMT, uterus nontender, nonenlarged, adnexa without tenderness, enlargement, or mass   Musculoskeletal: Normal range of motion.        General: No edema.  Neurological: She is alert and oriented to person, place, and time.  Psychiatric: She has a normal mood and affect. Her behavior is normal. Thought content normal.    MAU  Course  Procedures  MDM Orders Placed This Encounter  Procedures  . Urinalysis, Routine w reflex microscopic  . CBC   Lab results reviewed:  Results for orders placed or performed during the hospital encounter of 06/04/19 (from the past 24 hour(s))  CBC     Status: Abnormal   Collection Time: 06/04/19 11:07 PM  Result Value Ref Range   WBC 8.4 4.0 - 10.5 K/uL   RBC 4.89 3.87 - 5.11 MIL/uL   Hemoglobin 12.2 12.0 - 15.0 g/dL   HCT 16.137.7 09.636.0 - 04.546.0 %   MCV 77.1 (L) 80.0 - 100.0 fL   MCH 24.9 (L) 26.0 - 34.0 pg   MCHC 32.4 30.0 - 36.0 g/dL   RDW 40.915.6 (H) 81.111.5 - 91.415.5 %   Platelets 196 150 - 400 K/uL   nRBC 0.0 0.0 - 0.2 %   Educated and discussed process of vaginal bleeding postpartum. Educated on bleeding should stop over next 2-3 days, discussed reasons to return to MAU for assessment. Educated on possibility of starting menstrual cycle 2-3 months after delivery while breastfeeding, patient verbalizes understanding.  Hgb stable at this time, scant vaginal bleeding, unlikely of retained products. Pt stable at time of discharge.   Assessment and Plan   1. Normal postpartum vaginal bleeding and examination   Discharge home Make appointment to be seen for PP care  Discussed reasons to return to MAU  Educated on the process of vaginal bleeding PP   Follow-up Information    [facility] Follow up.   Specialty: Obstetrics and Gynecology Why: Return to MAU as needed for emergencies  Contact information: [address] [id number] [place] [id number] [phone]         Allergies as of 06/04/2019   No Known Allergies     Medication List    STOP taking these medications   amoxicillin 500 MG capsule Commonly known as: AMOXIL     TAKE these medications   ibuprofen 600 MG tablet Commonly known as: ADVIL Take 1 tablet (600 mg total) by mouth every 6 (six) hours.   magnesium oxide 400 (241.3 Mg) MG tablet Commonly known as:  MAG-OX Take 1 tablet (400 mg total) by mouth daily.   One-A-Day Womens Prenatal 1 28-0.8-235 MG Caps Take 1 capsule by mouth daily.   Poly-Iron 150 150 MG capsule Generic drug: iron polysaccharides TK 1 C PO QD      Sharyon CableVeronica C Emie Sommerfeld CNM  06/04/2019, 11:55 PM

## 2019-06-09 ENCOUNTER — Telehealth (HOSPITAL_COMMUNITY): Payer: Self-pay

## 2019-06-09 NOTE — Telephone Encounter (Signed)
New message   Just an FYI. We have made several attempts to contact this patient including sending a letter to schedule or reschedule their echocardiogram. We will be removing the patient from the echo [place].    8.6.20 @ lm on home vm Lindsay Simmons 6.12.20 @ 2:14pm lm on home vm - gesial 6.3.20 @ 4:07pm lm on home vm - Lindsay Simmons

## 2019-06-14 ENCOUNTER — Ambulatory Visit (HOSPITAL_COMMUNITY): Payer: Medicaid Other | Attending: Cardiovascular Disease

## 2019-06-14 ENCOUNTER — Other Ambulatory Visit: Payer: Self-pay

## 2019-06-14 DIAGNOSIS — R011 Cardiac murmur, unspecified: Secondary | ICD-10-CM | POA: Diagnosis not present

## 2019-06-15 DIAGNOSIS — Z30011 Encounter for initial prescription of contraceptive pills: Secondary | ICD-10-CM | POA: Diagnosis not present

## 2019-12-04 ENCOUNTER — Encounter (HOSPITAL_COMMUNITY): Payer: Self-pay

## 2019-12-04 ENCOUNTER — Other Ambulatory Visit: Payer: Self-pay

## 2019-12-04 ENCOUNTER — Ambulatory Visit (HOSPITAL_COMMUNITY)
Admission: EM | Admit: 2019-12-04 | Discharge: 2019-12-04 | Disposition: A | Discharge status: Home / Self Care | Payer: Medicaid Other | Attending: Family Medicine | Admitting: Family Medicine

## 2019-12-04 DIAGNOSIS — H04321 Acute dacryocystitis of right lacrimal passage: Secondary | ICD-10-CM | POA: Diagnosis not present

## 2019-12-04 MED ORDER — AMOXICILLIN-POT CLAVULANATE 875-125 MG PO TABS: 1.0000 | ORAL_TABLET | Freq: Two times a day (BID) | ORAL | 0 refills | Status: DC

## 2019-12-04 NOTE — ED Provider Notes (Signed)
[facility]    CSN: [id number] Arrival date & time: 12/04/19  0809      History   Chief Complaint Chief Complaint  Patient presents with  . Eye Problem    HPI Lindsay Simmons is a 24 y.o. female.   Patient is a 24-year female presents today with right eye pain, irritation in her eye swelling.  This is been constant and worsening the past 3 days.  She has been doing warm compresses without much relief.  Denies any specific drainage from the eye but she has had crusting to eye with waking in the mornings.  No fever, chills, body aches, nasal congestion, rhinorrhea.  No trouble with vision.  ROS per HPI      Past Medical History:  Diagnosis Date  . Anemia   . Heart murmur     Patient Active Problem List   Diagnosis Date Noted  . SVD (spontaneous vaginal delivery) 04/23/2019  . Postpartum hemorrhage 04/23/2019  . Retained placenta or membranes 04/23/2019  . Postpartum care following vaginal delivery (6/21) 04/23/2019  . Prolonged latent phase of labor 04/22/2019  . Heart murmur 01/08/2019  . Supervision of normal first pregnancy, antepartum 10/19/2018    Past Surgical History:  Procedure Laterality Date  . NO PAST SURGERIES      OB History    Gravida  1   Para  1   Term  1   Preterm      AB      Living  1     SAB      TAB      Ectopic      Multiple  0   Live Births  1            Home Medications    Prior to Admission medications   Medication Sig Start Date End Date Taking? Authorizing Provider  amoxicillin-clavulanate (AUGMENTIN) 875-125 MG tablet Take 1 tablet by mouth every 12 (twelve) hours. 12/04/19   Loura Halt A, NP  ibuprofen (ADVIL) 600 MG tablet Take 1 tablet (600 mg total) by mouth every 6 (six) hours. 04/25/19   Artelia Laroche, CNM  magnesium oxide (MAG-OX) 400 (241.3 Mg) MG tablet Take 1 tablet (400 mg total) by mouth daily. 04/26/19   Artelia Laroche, CNM  POLY-IRON 150 150 MG capsule TK 1 C PO QD 03/01/19   [provider]  Prenat-Fe Carbonyl-FA-Omega 3 (ONE-A-DAY WOMENS PRENATAL 1) 28-0.8-235 MG CAPS Take 1 capsule by mouth daily. 10/19/18   Laury Deep, CNM    Family History History reviewed. No pertinent family history.  Social History Social History   Tobacco Use  . Smoking status: Never Smoker  . Smokeless tobacco: Never Used  Substance Use Topics  . Alcohol use: Never  . Drug use: Never     Allergies   Patient has no known allergies.   Review of Systems Review of Systems   Physical Exam Triage Vital Signs ED Triage Vitals  Enc Vitals Group     BP 12/04/19 0836 111/75     Pulse Rate 12/04/19 0836 62     Resp 12/04/19 0836 18     Temp 12/04/19 0836 98 F (36.7 C)     Temp Source 12/04/19 0836 Oral     SpO2 12/04/19 0836 99 %     Weight --      Height --      Head Circumference --      Peak Flow --  Pain Score 12/04/19 0837 5     Pain Loc --      Pain Edu? --      Excl. in GC? --    No data found.  Updated Vital Signs BP 111/75 (BP Location: Left Arm)   Pulse 62   Temp 98 F (36.7 C) (Oral)   Resp 18   LMP 11/26/2019   SpO2 99%   Visual Acuity Right Eye Distance:   Left Eye Distance:   Bilateral Distance:    Right Eye Near:   Left Eye Near:    Bilateral Near:     Physical Exam Vitals and nursing note reviewed.  Constitutional:      Appearance: Normal appearance.  HENT:     Head: Normocephalic and atraumatic.     Nose: Nose normal.     Mouth/Throat:     Pharynx: Oropharynx is clear.  Eyes:     General: No scleral icterus.       Right eye: No discharge.        Left eye: No discharge.     Extraocular Movements: Extraocular movements intact.     Pupils: Pupils are equal, round, and reactive to light.     Comments: See picture for detail  Pulmonary:     Effort: Pulmonary effort is normal.  Musculoskeletal:        General: Normal range of motion.  Skin:    General: Skin is warm and dry.  Neurological:     Mental Status: She is  alert.  Psychiatric:        Mood and Affect: Mood normal.        UC Treatments / Results  Labs (all labs ordered are listed, but only abnormal results are displayed) Labs Reviewed - No data to display  EKG   Radiology No results found.  Procedures Procedures (including critical care time)  Medications Ordered in UC Medications - No data to display  Initial Impression / Assessment and Plan / UC Course  I have reviewed the triage vital signs and the nursing notes.  Pertinent labs & imaging results that were available during my care of the patient were reviewed by me and considered in my medical decision making (see chart for details).     Dacryocystitis - most likely dx based on presentation  Will have her do warm compresses and massage the area.  abx coverage with Augmentin.  Recommended if symptoms continue or worsen she will need to see opthalmology. Contact put on discharge instruction along with information.   Final Clinical Impressions(s) / UC Diagnoses   Final diagnoses:  Dacryocystitis, acute, right     Discharge Instructions     Treating with antibiotics You need to continue the warm compresses and massage the area.  If the problem continues follow up with opthalmology.     ED Prescriptions    Medication Sig Dispense Auth. Provider   amoxicillin-clavulanate (AUGMENTIN) 875-125 MG tablet Take 1 tablet by mouth every 12 (twelve) hours. 14 tablet Hermena Swint A, NP     PDMP not reviewed this encounter.   Dahlia Byes A, NP 12/04/19 [phone]

## 2019-12-04 NOTE — ED Triage Notes (Signed)
Pt presents with right eye irritation and pain X 3 days.

## 2019-12-04 NOTE — Discharge Instructions (Signed)
Treating with antibiotics You need to continue the warm compresses and massage the area.  If the problem continues follow up with opthalmology.

## 2020-01-30 DIAGNOSIS — H1032 Unspecified acute conjunctivitis, left eye: Secondary | ICD-10-CM | POA: Diagnosis not present

## 2020-03-07 DIAGNOSIS — H00022 Hordeolum internum right lower eyelid: Secondary | ICD-10-CM | POA: Diagnosis not present

## 2020-09-24 IMAGING — US US MFM OB FOLLOW-UP
1 series · 14 of 28 positions shown · non-contrast
Comparison: none

[Series 1: us mfm ob follow-up · 14 of 46 slices shown]
[im 2/46]
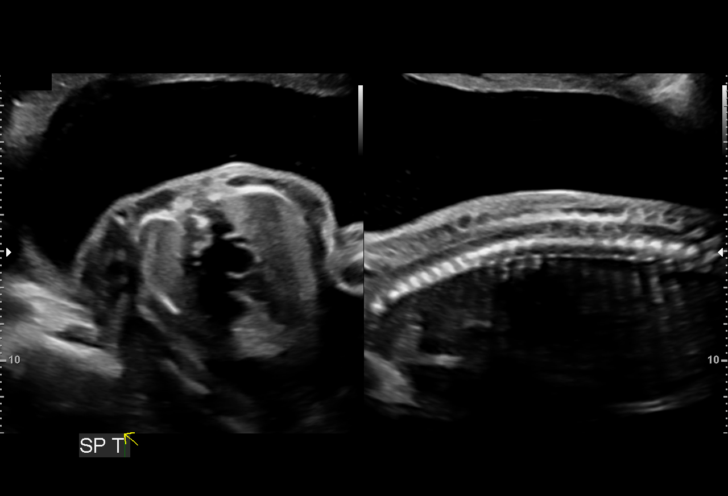
[im 6/46]
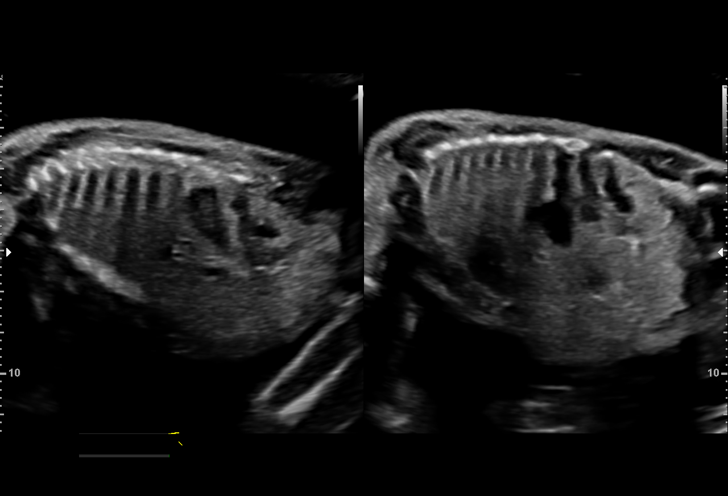
[im 9/46]
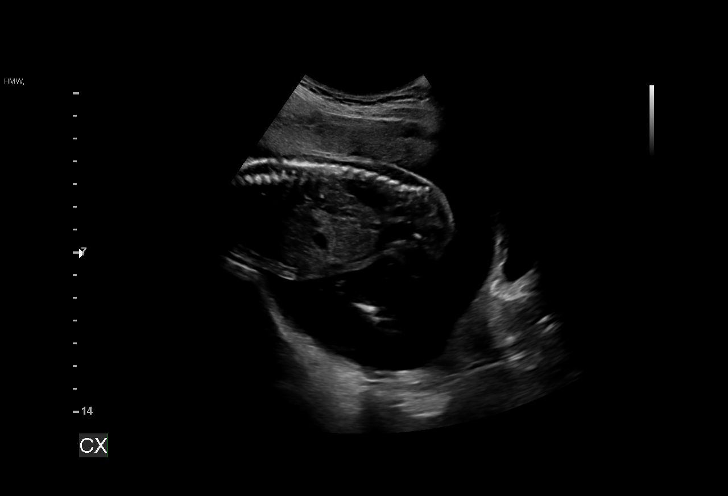
[im 12/46]
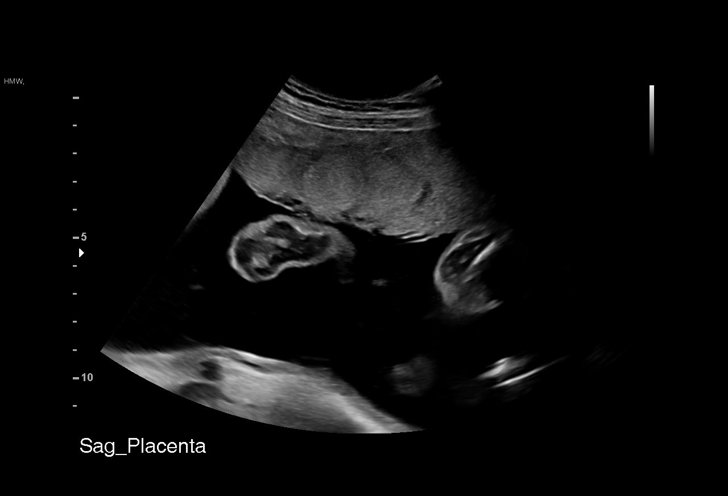
[im 16/46]
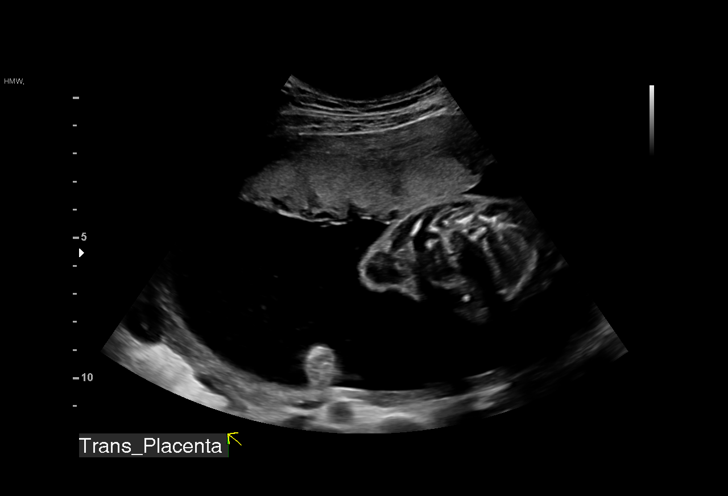
[im 19/46]
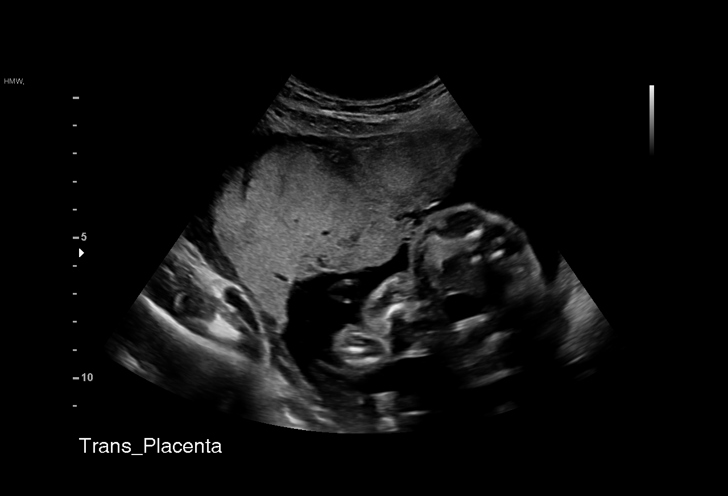
[im 22/46]
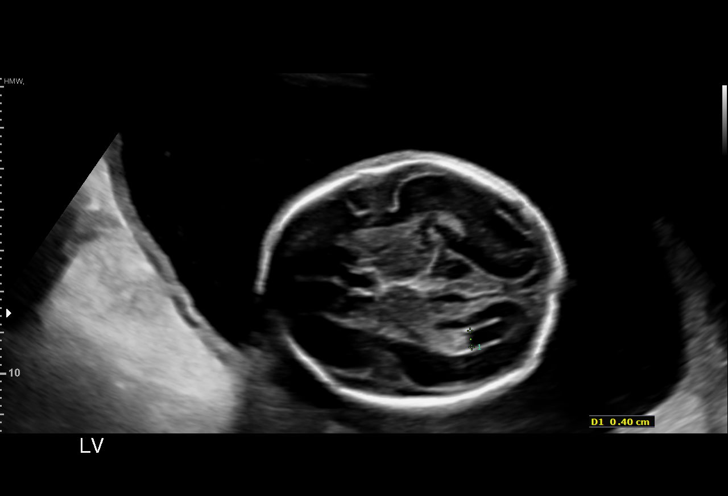
[im 26/46]
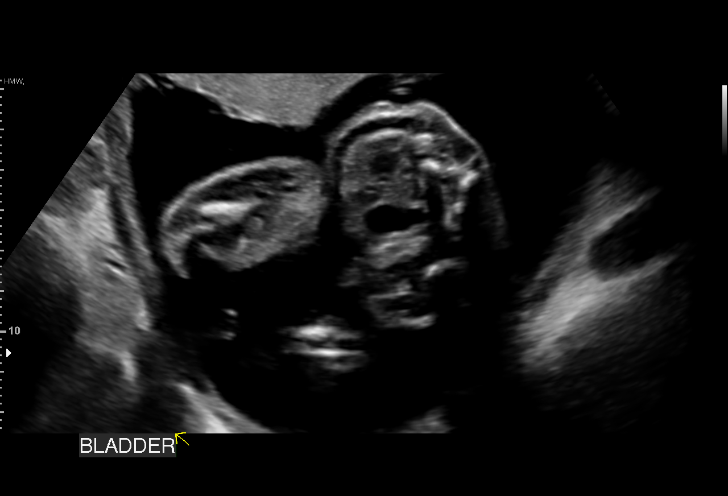
[im 29/46]
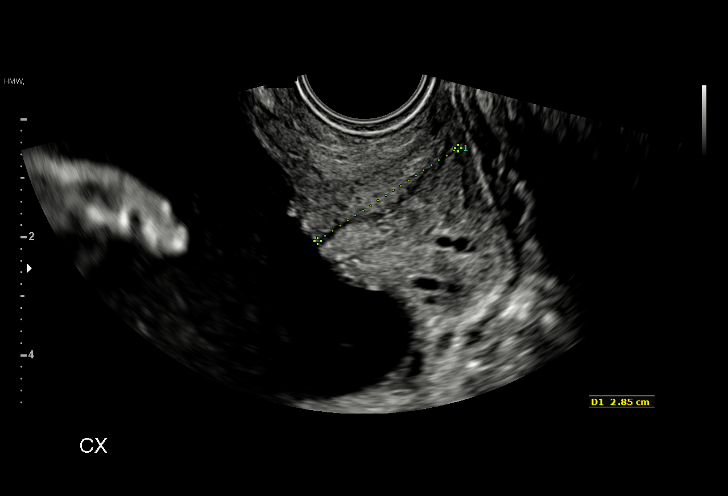
[im 32/46]
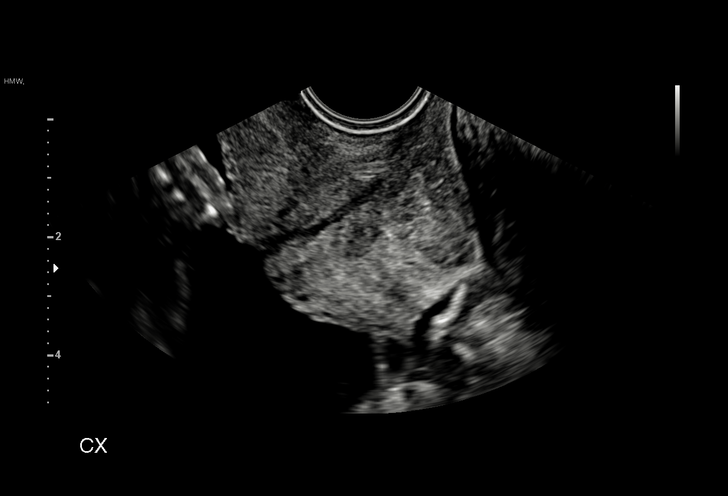
[im 36/46]
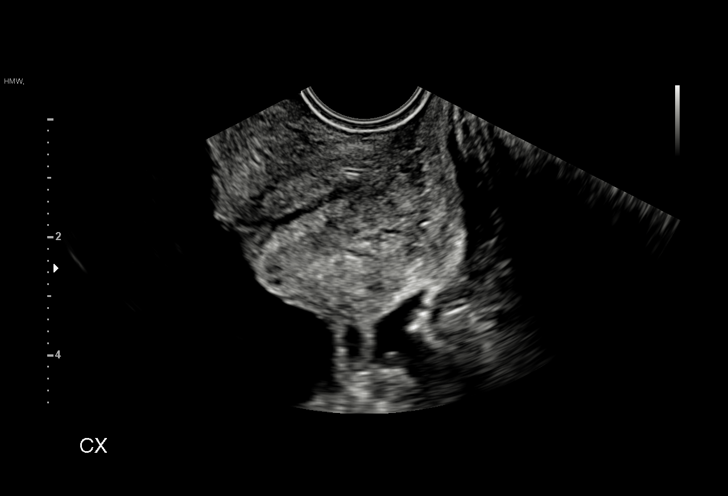
[im 39/46]
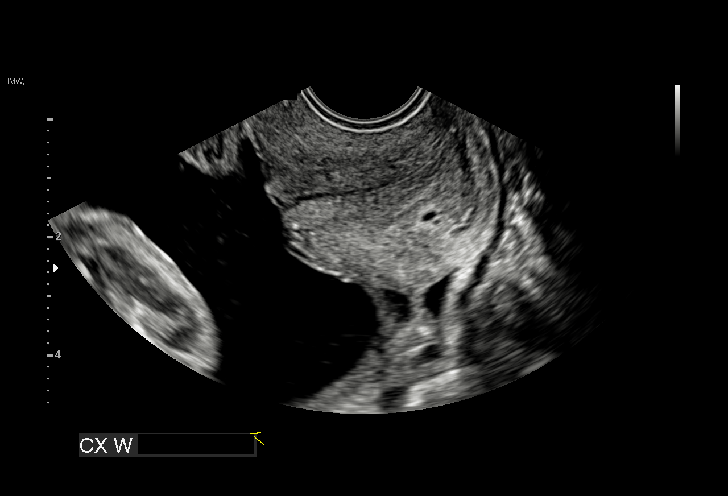
[im 42/46]
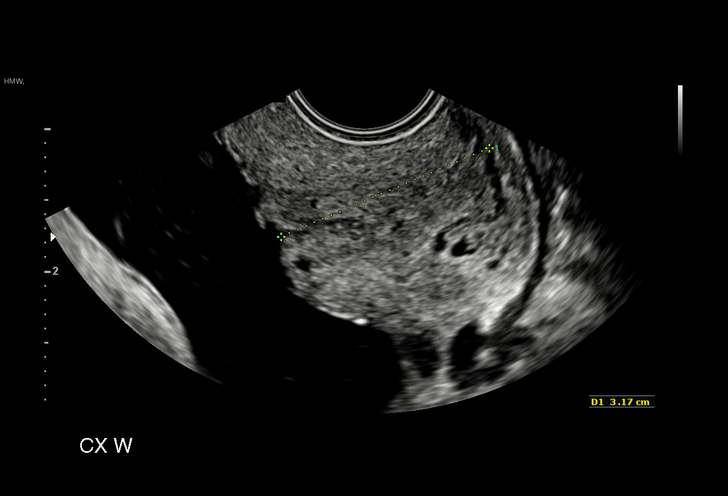
[im 46/46]
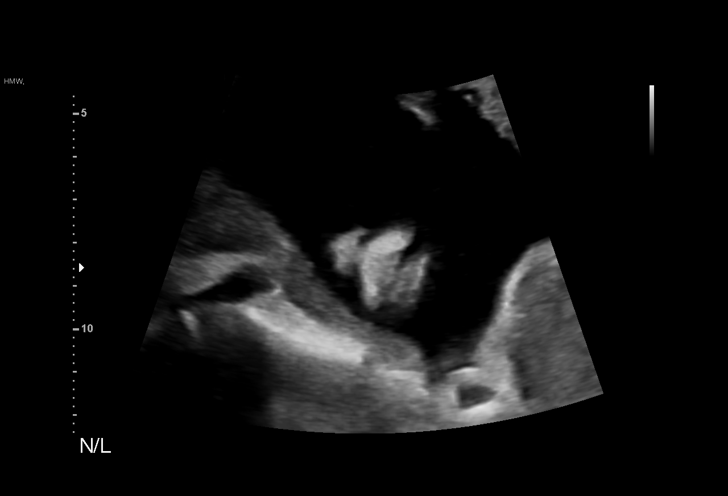

[14 of 28 positions shown; findings below may reference images not displayed]

for [REDACTED]care

 ----------------------------------------------------------------------

 ----------------------------------------------------------------------
Indications

  22 weeks gestation of pregnancy
  Cervical shortening, second trimester
  Encounter for cervical length
  Encounter for other antenatal screening
  follow-up (low risk NIPS)
 ----------------------------------------------------------------------
Vital Signs

                                                 Height:        5'4"
Fetal Evaluation

 Num Of Fetuses:          1
 Fetal Heart Rate(bpm):   155
 Cardiac Activity:        Observed
 Presentation:            Breech
 Placenta:                Anterior
 P. Cord Insertion:       Visualized, central

 Amniotic Fluid
 AFI FV:      Within normal limits

                             Largest Pocket(cm)

Biometry
 BPD:      57.9   mm     G. Age:  23w 5d         93  %    CI:         74.76  %    70 - 86
                                                          FL/HC:       18.6  %    18.4 -
 HC:      212.5   mm     G. Age:  23w 2d         82  %    HC/AC:       1.11       1.06 -
 AC:      191.9   mm     G. Age:  23w 6d         90  %    FL/BPD:      68.2  %    71 - 87
 FL:       39.5   mm     G. Age:  22w 5d         59  %    FL/AC:       20.6  %    20 - 24

 Est. FW:     591   gm      1 lb 5 oz     67  %
OB History

 Gravidity:     1
Gestational Age

 LMP:            22w 1d       Date:  07/21/18                   EDD:  04/27/19
 U/S Today:      23w 3d                                         EDD:  04/18/19
 Best:           22w 1d    Det. By:  LMP  (07/21/18)            EDD:  04/27/19
Anatomy

 Cranium:                Appears normal         Aortic Arch:            Previously seen
 Cavum:                  Appears normal         Ductal Arch:            Previously seen
 Ventricles:             Appears normal         Diaphragm:              Previously seen
 Choroid Plexus:         Previously seen        Stomach:                Appears normal, left
                                                                        sided
 Cerebellum:             Previously seen        Abdomen:                Previously seen
 Posterior Fossa:        Previously seen        Abdominal Wall:         Previously seen
 Nuchal Fold:            Previously seen        Cord Vessels:           Previously seen
 Face:                   Orbits nl; profile not Kidneys:                Appear normal
                         well visualized
 Lips:                   Not well visualized    Bladder:                Appears normal
 Thoracic:               Appears normal         Spine:                  Appears normal
 Heart:                  Appears normal         Upper Extremities:      Previously seen
                         (4CH, axis, and situs)
 RVOT:                   Appears normal         Lower Extremities:      Previously seen
 LVOT:                   Appears normal

 Other:   Fetus appears to be a male. Heels and RT 5th digit visualized previously.
          Open hands visualized previously. Nasal bone visualized previously.
          Technically difficult due to fetal position.
Cervix Uterus Adnexa

 Cervix
 Length:             3.2  cm.
 Measured transvaginally.
Impression

 Normal interval growth.
 Normal cervical length
 Suboptimal views of the fetal anatomy was obtained secondary
 to fetal position.
Recommendations

 Follow up CL in 2 weeks and growth in 4 weeks to complete the
 fetal anatomy.

## 2020-10-11 ENCOUNTER — Ambulatory Visit
Admission: EM | Admit: 2020-10-11 | Discharge: 2020-10-11 | Disposition: A | Discharge status: Home / Self Care | Payer: Medicaid Other | Attending: Family Medicine | Admitting: Family Medicine

## 2020-10-11 ENCOUNTER — Other Ambulatory Visit: Payer: Self-pay

## 2020-10-11 DIAGNOSIS — H6062 Unspecified chronic otitis externa, left ear: Secondary | ICD-10-CM

## 2020-10-11 NOTE — ED Provider Notes (Signed)
[facility]    CSN: [id number] Arrival date & time: 10/11/20  1617      History   Chief Complaint Chief Complaint  Patient presents with  . Otalgia    X 2 days with swelling    HPI Lindsay Simmons is a 24 y.o. female. C/O earache and decreased hearing. No fever  HPI  Past Medical History:  Diagnosis Date  . Anemia   . Heart murmur     Patient Active Problem List   Diagnosis Date Noted  . SVD (spontaneous vaginal delivery) 04/23/2019  . Postpartum hemorrhage 04/23/2019  . Retained placenta or membranes 04/23/2019  . Postpartum care following vaginal delivery (6/21) 04/23/2019  . Prolonged latent phase of labor 04/22/2019  . Heart murmur 01/08/2019  . Supervision of normal first pregnancy, antepartum 10/19/2018    Past Surgical History:  Procedure Laterality Date  . NO PAST SURGERIES      OB History    Gravida  1   Para  1   Term  1   Preterm      AB      Living  1     SAB      IAB      Ectopic      Multiple  0   Live Births  1            Home Medications    Prior to Admission medications   Medication Sig Start Date End Date Taking? Authorizing Provider  ibuprofen (ADVIL) 600 MG tablet Take 1 tablet (600 mg total) by mouth every 6 (six) hours. 04/25/19  Yes Marlinda Mike., CNM  magnesium oxide (MAG-OX) 400 (241.3 Mg) MG tablet Take 1 tablet (400 mg total) by mouth daily. 04/26/19  Yes Marlinda Mike., CNM  amoxicillin-clavulanate (AUGMENTIN) 875-125 MG tablet Take 1 tablet by mouth every 12 (twelve) hours. 12/04/19   Dahlia Byes A, NP  POLY-IRON 150 150 MG capsule TK 1 C PO QD 03/01/19   [provider]  Prenat-Fe Carbonyl-FA-Omega 3 (ONE-A-DAY WOMENS PRENATAL 1) 28-0.8-235 MG CAPS Take 1 capsule by mouth daily. 10/19/18   Raelyn Mora, CNM    Family History History reviewed. No pertinent family history.  Social History Social History   Tobacco Use  . Smoking status: Never Smoker  . Smokeless tobacco: Never  Used  Vaping Use  . Vaping Use: Never used  Substance Use Topics  . Alcohol use: Never  . Drug use: Never     Allergies   Patient has no known allergies.   Review of Systems Review of Systems  HENT: Positive for ear pain. Negative for ear discharge.   All other systems reviewed and are negative.    Physical Exam Triage Vital Signs ED Triage Vitals  Enc Vitals Group     BP 10/11/20 1640 110/64     Pulse Rate 10/11/20 1640 95     Resp 10/11/20 1640 20     Temp 10/11/20 1640 98.2 F (36.8 C)     Temp Source 10/11/20 1640 Oral     SpO2 10/11/20 1640 98 %     Weight --      Height --      Head Circumference --      Peak Flow --      Pain Score 10/11/20 1652 6     Pain Loc --      Pain Edu? --      Excl. in GC? --    No data found.  Updated Vital Signs BP 110/64 (BP Location: Left Arm)   Pulse 95   Temp 98.2 F (36.8 C) (Oral)   Resp 20   SpO2 98%   Visual Acuity Right Eye Distance:   Left Eye Distance:   Bilateral Distance:    Right Eye Near:   Left Eye Near:    Bilateral Near:     Physical Exam Vitals reviewed.  HENT:     Ears:     Comments: L EAC occluded; needs irrigation before diagnosis can be made Cardiovascular:     Rate and Rhythm: Normal rate.  Pulmonary:     Effort: Pulmonary effort is normal.  Neurological:     Mental Status: She is alert.      UC Treatments / Results  Labs (all labs ordered are listed, but only abnormal results are displayed) Labs Reviewed - No data to display  EKG   Radiology No results found.  Procedures Procedures (including critical care time)  Medications Ordered in UC Medications - No data to display  Initial Impression / Assessment and Plan / UC Course  I have reviewed the triage vital signs and the nursing notes.  Pertinent labs & imaging results that were available during my care of the patient were reviewed by me and considered in my medical decision making (see chart for details).      Cerumen impaction.  Cannot rule out middle or external otitis until cerumen has been cleared.  We irrigated multiple times with some minimal success but I have asked her to go home and continue application of peroxide drops and return in 2 to 3 days for further irrigation and diagnosis.  Apparently this has been a chronic problem with this person if she has been to ENT doctors Final Clinical Impressions(s) / UC Diagnoses   Final diagnoses:  None   Discharge Instructions   None    ED Prescriptions    None     PDMP not reviewed this encounter.   Frederica Kuster, MD 10/11/20 1750

## 2020-10-11 NOTE — ED Triage Notes (Signed)
Patient states she has had worsening ear pain and swelling x 2 days and is starting to have pain on the left side when chewing. Pt is aox4 and ambulatory.

## 2020-10-12 DIAGNOSIS — H60502 Unspecified acute noninfective otitis externa, left ear: Secondary | ICD-10-CM | POA: Diagnosis not present

## 2020-10-12 DIAGNOSIS — H6122 Impacted cerumen, left ear: Secondary | ICD-10-CM | POA: Diagnosis not present

## 2020-11-26 DIAGNOSIS — Z3041 Encounter for surveillance of contraceptive pills: Secondary | ICD-10-CM | POA: Diagnosis not present

## 2021-02-07 ENCOUNTER — Encounter: Payer: Self-pay | Admitting: Emergency Medicine

## 2021-02-07 ENCOUNTER — Ambulatory Visit
Admission: EM | Admit: 2021-02-07 | Discharge: 2021-02-07 | Disposition: A | Discharge status: Home / Self Care | Payer: Medicaid Other | Attending: Internal Medicine | Admitting: Internal Medicine

## 2021-02-07 ENCOUNTER — Other Ambulatory Visit: Payer: Self-pay

## 2021-02-07 DIAGNOSIS — H109 Unspecified conjunctivitis: Secondary | ICD-10-CM | POA: Diagnosis not present

## 2021-02-07 MED ORDER — BACITRACIN-POLYMYXIN B 500-10000 UNIT/GM OP OINT: 1.0000 "application " | TOPICAL_OINTMENT | Freq: Two times a day (BID) | OPHTHALMIC | 0 refills | Status: DC

## 2021-02-07 NOTE — ED Provider Notes (Signed)
[facility]    CSN: [id number] Arrival date & time: 02/07/21  1633      History   Chief Complaint No chief complaint on file.   HPI Lindsay Simmons is a 25 y.o. female who noticed redness of  L eye, and this am worke up with crusting and has been draining yellow matter. She used some visine drops this am, so the redness is not as bad.     Past Medical History:  Diagnosis Date  . Anemia   . Heart murmur     Patient Active Problem List   Diagnosis Date Noted  . SVD (spontaneous vaginal delivery) 04/23/2019  . Postpartum hemorrhage 04/23/2019  . Retained placenta or membranes 04/23/2019  . Postpartum care following vaginal delivery (6/21) 04/23/2019  . Prolonged latent phase of labor 04/22/2019  . Heart murmur 01/08/2019  . Supervision of normal first pregnancy, antepartum 10/19/2018    Past Surgical History:  Procedure Laterality Date  . NO PAST SURGERIES      OB History    Gravida  1   Para  1   Term  1   Preterm      AB      Living  1     SAB      IAB      Ectopic      Multiple  0   Live Births  1            Home Medications    Prior to Admission medications   Medication Sig Start Date End Date Taking? Authorizing Provider  bacitracin-polymyxin b (POLYSPORIN) ophthalmic ointment Place 1 application into the left eye 2 (two) times daily. apply to eye every 12 hours while awake 02/07/21  Yes Rodriguez-Southworth, Nettie Elm, PA-C  ibuprofen (ADVIL) 600 MG tablet Take 1 tablet (600 mg total) by mouth every 6 (six) hours. 04/25/19   Marlinda Mike., CNM  POLY-IRON 150 150 MG capsule TK 1 C PO QD 03/01/19 02/07/21  [provider]    Family History History reviewed. No pertinent family history.  Social History Social History   Tobacco Use  . Smoking status: Never Smoker  . Smokeless tobacco: Never Used  Vaping Use  . Vaping Use: Never used  Substance Use Topics  . Alcohol use: Never  . Drug use: Never     Allergies    Patient has no known allergies.   Review of Systems Review of Systems  HENT: Positive for postnasal drip. Negative for congestion, sore throat and trouble swallowing.   Eyes: Positive for discharge, redness and itching. Negative for photophobia, pain and visual disturbance.  Respiratory: Negative for cough.   Musculoskeletal: Negative for myalgias.  Skin: Negative for rash.  Neurological: Negative for headaches.  Hematological: Negative for adenopathy.     Physical Exam Triage Vital Signs ED Triage Vitals  Enc Vitals Group     BP 02/07/21 1647 (!) 110/58     Pulse Rate 02/07/21 1647 61     Resp 02/07/21 1647 16     Temp 02/07/21 1647 98.1 F (36.7 C)     Temp Source 02/07/21 1647 Oral     SpO2 02/07/21 1647 98 %     Weight --      Height --      Head Circumference --      Peak Flow --      Pain Score 02/07/21 1651 0     Pain Loc --      Pain Edu? --  Excl. in [place]? --    No data found.  Updated Vital Signs BP (!) 110/58   Pulse 61   Temp 98.1 F (36.7 C) (Oral)   Resp 16   LMP 01/16/2021   SpO2 98%   Visual Acuity Right Eye Distance:   Left Eye Distance:   Bilateral Distance:    Right Eye Near:   Left Eye Near:    Bilateral Near:     Physical Exam Vitals and nursing note reviewed.  Constitutional:      General: She is not in acute distress.    Appearance: She is normal weight. She is not toxic-appearing.  HENT:     Right Ear: Tympanic membrane, ear canal and external ear normal.     Left Ear: Tympanic membrane, ear canal and external ear normal.     Nose: Nose normal.     Mouth/Throat:     Mouth: Mucous membranes are moist.     Pharynx: Oropharynx is clear.  Eyes:     Pupils: Pupils are equal, round, and reactive to light.     Comments: Has slightly injected L sclera and mild light yellow matter   Pulmonary:     Effort: Pulmonary effort is normal.  Musculoskeletal:        General: Normal range of motion.     Cervical back: Neck supple.   Lymphadenopathy:     Cervical: No cervical adenopathy.  Skin:    General: Skin is warm and dry.     Findings: No rash.  Neurological:     Mental Status: She is alert and oriented to person, place, and time.     Gait: Gait normal.  Psychiatric:        Mood and Affect: Mood normal.        Behavior: Behavior normal.        Thought Content: Thought content normal.        Judgment: Judgment normal.      UC Treatments / Results  Labs (all labs ordered are listed, but only abnormal results are displayed) Labs Reviewed - No data to display  EKG   Radiology No results found.  Procedures Procedures (including critical care time)  Medications Ordered in UC Medications - No data to display  Initial Impression / Assessment and Plan / UC Course  I have reviewed the triage vital signs and the nursing notes. L bacterial conjunctivitis. I placed her on antibiotic ointment as noted.  Final Clinical Impressions(s) / UC Diagnoses   Final diagnoses:  Bacterial conjunctivitis of left eye   Discharge Instructions   None    ED Prescriptions    Medication Sig Dispense Auth. Provider   bacitracin-polymyxin b (POLYSPORIN) ophthalmic ointment Place 1 application into the left eye 2 (two) times daily. apply to eye every 12 hours while awake 3.5 g Rodriguez-Southworth, Nettie Elm, PA-C     PDMP not reviewed this encounter.   Garey Ham, Cordelia Poche 02/07/21 2004

## 2021-02-07 NOTE — ED Triage Notes (Signed)
Left eye drainage, itchy since this morning.

## 2021-03-03 DIAGNOSIS — H66001 Acute suppurative otitis media without spontaneous rupture of ear drum, right ear: Secondary | ICD-10-CM | POA: Diagnosis not present

## 2021-03-03 DIAGNOSIS — H6123 Impacted cerumen, bilateral: Secondary | ICD-10-CM | POA: Diagnosis not present

## 2021-08-05 DIAGNOSIS — Z30016 Encounter for initial prescription of transdermal patch hormonal contraceptive device: Secondary | ICD-10-CM | POA: Diagnosis not present

## 2021-08-22 DIAGNOSIS — J029 Acute pharyngitis, unspecified: Secondary | ICD-10-CM | POA: Diagnosis not present

## 2021-08-22 DIAGNOSIS — Z20822 Contact with and (suspected) exposure to covid-19: Secondary | ICD-10-CM | POA: Diagnosis not present

## 2021-08-22 DIAGNOSIS — J069 Acute upper respiratory infection, unspecified: Secondary | ICD-10-CM | POA: Diagnosis not present

## 2021-10-31 ENCOUNTER — Encounter: Payer: Self-pay | Admitting: Emergency Medicine

## 2021-10-31 ENCOUNTER — Other Ambulatory Visit: Payer: Self-pay

## 2021-10-31 ENCOUNTER — Ambulatory Visit
Admission: EM | Admit: 2021-10-31 | Discharge: 2021-10-31 | Disposition: A | Discharge status: Home / Self Care | Payer: Medicaid Other | Attending: Family Medicine | Admitting: Family Medicine

## 2021-10-31 DIAGNOSIS — H00014 Hordeolum externum left upper eyelid: Secondary | ICD-10-CM | POA: Diagnosis not present

## 2021-10-31 MED ORDER — ERYTHROMYCIN 5 MG/GM OP OINT: TOPICAL_OINTMENT | OPHTHALMIC | 0 refills | Status: DC

## 2021-10-31 NOTE — ED Triage Notes (Signed)
Has has a sty under left eye x 4 days.  Last night it "released" and states pus come out.  States today eye is more swollen.

## 2021-10-31 NOTE — ED Provider Notes (Signed)
[facility]    CSN: [id number] Arrival date & time: 10/31/21  7867      History   Chief Complaint No chief complaint on file.   HPI Lindsay Simmons is a 25 y.o. female.   Patient presenting today with 4-day history of a stye to the left upper eyelid that she states burst did last night with thick purulent drainage.  States the eyelid is more red and swollen this morning, sore.  Denies injury to the eye, vision changes, conjunctival changes, fever, chills, headaches, nausea, vomiting.  Has been using warm compresses with no relief.   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 Authorizing Provider  erythromycin ophthalmic ointment Place a 1/2 inch ribbon of ointment into the left lower eyelid BID. 10/31/21  Yes Particia Nearing, PA-C  bacitracin-polymyxin b (POLYSPORIN) ophthalmic ointment Place 1 application into the left eye 2 (two) times daily. apply to eye every 12 hours while awake 02/07/21   Rodriguez-Southworth, Nettie Elm, PA-C  ibuprofen (ADVIL) 600 MG tablet Take 1 tablet (600 mg total) by mouth every 6 (six) hours. 04/25/19   Marlinda Mike, CNM  POLY-IRON 150 150 MG capsule TK 1 C PO QD 03/01/19 02/07/21   [provider]    Family History History reviewed. No pertinent family history.  Social History Social History   Tobacco Use   Smoking status: Never   Smokeless tobacco: Never  Vaping Use   Vaping Use: Never used  Substance Use Topics   Alcohol use: Never   Drug use: Never     Allergies   Patient has no known allergies.   Review of Systems Review of Systems Per HPI  Physical Exam Triage Vital Signs ED Triage Vitals  Enc Vitals Group     BP 10/31/21 0843 111/72     Pulse Rate 10/31/21 0843 82     Resp 10/31/21 0843 18     Temp 10/31/21 0843 98.6 F (37 C)     Temp Source 10/31/21 0843 Oral     SpO2 10/31/21 0843 99 %     Weight --      Height --      Head Circumference --      Peak Flow --      Pain Score 10/31/21 0844 6     Pain Loc --      Pain Edu? --      Excl.  in [place]? --    No data found.  Updated Vital Signs BP 111/72 (BP Location: Right Arm)    Pulse 82    Temp 98.6 F (37 C) (Oral)    Resp 18    LMP 10/25/2021 (Exact Date)    SpO2 99%   Visual Acuity Right Eye Distance:   Left Eye Distance:   Bilateral Distance:    Right Eye Near:   Left Eye Near:    Bilateral Near:     Physical Exam Vitals and nursing note reviewed.  Constitutional:      Appearance: Normal appearance. She is not ill-appearing.  HENT:     Head: Atraumatic.     Nose: Nose normal.     Mouth/Throat:     Mouth: Mucous membranes are moist.     Pharynx: Oropharynx is clear.  Eyes:     Extraocular Movements: Extraocular movements intact.     Conjunctiva/sclera: Conjunctivae normal.     Pupils: Pupils are equal, round, and reactive to light.     Comments: Mild erythema and edema to left upper eyelid.  No active drainage  Cardiovascular:     Rate and Rhythm: Normal rate and regular rhythm.     Heart sounds: Normal heart sounds.  Pulmonary:     Effort: Pulmonary effort is normal.     Breath sounds: Normal breath sounds. No wheezing or rales.  Musculoskeletal:         General: Normal range of motion.     Cervical back: Normal range of motion and neck supple.  Skin:    General: Skin is warm and dry.  Neurological:     Mental Status: She is alert and oriented to person, place, and time.  Psychiatric:        Mood and Affect: Mood normal.        Thought Content: Thought content normal.        Judgment: Judgment normal.     UC Treatments / Results  Labs (all labs ordered are listed, but only abnormal results are displayed) Labs Reviewed - No data to display  EKG   Radiology No results found.  Procedures Procedures (including critical care time)  Medications Ordered in UC Medications - No data to display  Initial Impression / Assessment and Plan / UC Course  I have reviewed the triage vital signs and the nursing notes.  Pertinent labs & imaging results that were available during my care of the patient were reviewed by me and considered in my medical decision making (see chart for details).     Treat with erythromycin ointment, warm compresses, over-the-counter pain relievers as needed.  Return for acutely worsening symptoms.  Final Clinical Impressions(s) / UC Diagnoses   Final diagnoses:  Hordeolum externum of left upper eyelid   Discharge Instructions   None    ED Prescriptions     Medication Sig Dispense Auth. Provider   erythromycin ophthalmic ointment Place a 1/2 inch ribbon of ointment into the left lower eyelid BID. 3.5 g Volney American, PA-C      PDMP not reviewed this encounter.   Merrie Roof [place], [place] 10/31/21 [phone]

## 2021-11-17 ENCOUNTER — Other Ambulatory Visit: Payer: Self-pay

## 2021-11-17 ENCOUNTER — Encounter: Payer: Self-pay | Admitting: Emergency Medicine

## 2021-11-17 ENCOUNTER — Ambulatory Visit
Admission: EM | Admit: 2021-11-17 | Discharge: 2021-11-17 | Disposition: A | Discharge status: Home / Self Care | Payer: Medicaid Other | Attending: Urgent Care | Admitting: Urgent Care

## 2021-11-17 DIAGNOSIS — H109 Unspecified conjunctivitis: Secondary | ICD-10-CM

## 2021-11-17 MED ORDER — TOBRAMYCIN 0.3 % OP SOLN: 1.0000 [drp] | OPHTHALMIC | 0 refills | Status: DC

## 2021-11-17 NOTE — ED Provider Notes (Signed)
[facility]   MRN: [id number] DOB: 01-25-1996  Subjective:   Lindsay Simmons is a 26 y.o. female presenting for recurrent left eye redness, irritation, photosensitivity.  Patient was last seen on 10/31/2021 for a stye of the left eye.  At that visit she had reported that she had a stye the first.  She was prescribed erythromycin and has used it as needed.  Her symptoms had improved but the irritation has returned in the past few days.  No trauma, vision changes, contact lens use.  No current facility-administered medications for this encounter.  Current Outpatient Medications:    erythromycin ophthalmic ointment, Place a 1/2 inch ribbon of ointment into the left lower eyelid BID., Disp: 3.5 g, Rfl: 0   ibuprofen (ADVIL) 600 MG tablet, Take 1 tablet (600 mg total) by mouth every 6 (six) hours., Disp: 30 tablet, Rfl: 0   bacitracin-polymyxin b (POLYSPORIN) ophthalmic ointment, Place 1 application into the left eye 2 (two) times daily. apply to eye every 12 hours while awake, Disp: 3.5 g, Rfl: 0   No Known Allergies  Past Medical History:  Diagnosis Date   Anemia    Heart murmur      Past Surgical History:  Procedure Laterality Date   NO PAST SURGERIES      History reviewed. No pertinent family history.  Social History   Tobacco Use   Smoking status: Never   Smokeless tobacco: Never  Vaping Use   Vaping Use: Never used  Substance Use Topics   Alcohol use: Never   Drug use: Never    ROS   Objective:   Vitals: BP (!) 110/53 (BP Location: Right Arm)    Pulse 72    Temp 97.6 F (36.4 C)    Resp 18    Ht 5\' 4"  (1.626 m)    Wt 130 lb (59 kg)    LMP 10/25/2021 (Exact Date)    SpO2 98%    BMI 22.31 kg/m   Physical Exam Constitutional:      General: She is not in acute distress.    Appearance: Normal appearance. She is well-developed. She is not ill-appearing, toxic-appearing or diaphoretic.  HENT:     Head: Normocephalic and atraumatic.     Right Ear:  External ear normal.     Left Ear: External ear normal.     Nose: Nose normal.     Mouth/Throat:     Mouth: Mucous membranes are moist.  Eyes:     General: Lids are everted, no foreign bodies appreciated. No scleral icterus.       Right eye: No foreign body, discharge or hordeolum.        Left eye: No foreign body, discharge or hordeolum.     Extraocular Movements: Extraocular movements intact.     Right eye: Normal extraocular motion and no nystagmus.     Left eye: Normal extraocular motion and no nystagmus.     Conjunctiva/sclera:     Right eye: Right conjunctiva is not injected. No chemosis or exudate.    Left eye: Left conjunctiva is injected. No chemosis or exudate.    Pupils: Pupils are equal, round, and reactive to light.  Cardiovascular:     Rate and Rhythm: Normal rate.  Pulmonary:     Effort: Pulmonary effort is normal.  Skin:    General: Skin is warm and dry.  Neurological:     General: No focal deficit present.     Mental Status: She is alert and oriented  to person, place, and time.  Psychiatric:        Mood and Affect: Mood normal.        Behavior: Behavior normal.    Assessment and Plan :   PDMP not reviewed this encounter.  1. Bacterial conjunctivitis of left eye    Stop erythromycin ointment.  Use tobramycin for bacterial conjunctivitis of the left eye. Counseled patient on potential for adverse effects with medications prescribed/recommended today, ER and return-to-clinic precautions discussed, patient verbalized understanding.    Wallis Bamberg, [place] 11/17/21 [phone]

## 2021-11-17 NOTE — ED Triage Notes (Signed)
Pt reports was seen for stye on left eye and reports pain and sensitivity has returned in left eye.

## 2022-08-17 ENCOUNTER — Encounter: Payer: Self-pay | Admitting: *Deleted

## 2022-08-17 ENCOUNTER — Ambulatory Visit
Admission: EM | Admit: 2022-08-17 | Discharge: 2022-08-17 | Disposition: A | Discharge status: Home / Self Care | Payer: Medicaid Other

## 2022-08-17 DIAGNOSIS — R058 Other specified cough: Secondary | ICD-10-CM

## 2022-08-17 DIAGNOSIS — R0982 Postnasal drip: Secondary | ICD-10-CM | POA: Diagnosis not present

## 2022-08-17 MED ORDER — FLUTICASONE PROPIONATE 50 MCG/ACT NA SUSP: 1.0000 | Freq: Every day | NASAL | 0 refills | Status: DC

## 2022-08-17 MED ORDER — BENZONATATE 100 MG PO CAPS: 100.0000 mg | ORAL_CAPSULE | Freq: Three times a day (TID) | ORAL | 0 refills | Status: DC | PRN

## 2022-08-17 NOTE — ED Triage Notes (Signed)
Pt states cough x 3 weeks worse at  night. No other sx currently. She isnt taking nay meds now she took cough meds for like 2 days but she felt it was making her cough worse.

## 2022-08-17 NOTE — Discharge Instructions (Addendum)
Your symptoms are consistent with a post viral cough and post nasal drainage.   Please start the cough suppressants as needed for the dry cough.    You can also start the nasal spray to prevent drainage from going down the back of your throat.  If symptoms worsen or persist, follow up with [place] or PCP.

## 2022-08-17 NOTE — ED Provider Notes (Signed)
[facility]    CSN: [id number] Arrival date & time: 08/17/22  1032      History   Chief Complaint Chief Complaint  Patient presents with   Cough    HPI Lindsay Simmons is a 26 y.o. female.   Patient presents with 3 weeks of dry cough.  She also endorses some postnasal drainage.  Reports initially, when symptoms develop, she had low-grade fevers, body aches, chills, lots of nasal congestion, sore throat, headache, and decreased appetite.  Reports all the symptoms have improved and now she is up with the cough.  She denies any current shortness of breath, chest pain or tightness, pain with inspiration, chest or nasal congestion, runny nose, sore throat, headache, ear pain or pressure, ear drainage, abdominal pain, nausea/vomiting, diarrhea new rash, and fatigue.  She took over-the-counter cough medication initially, however felt the symptoms got worse and stopped them.    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 Authorizing Provider  benzonatate (TESSALON) 100 MG capsule Take 1 capsule (100 mg total) by mouth 3 (three) times daily as needed for cough. Do not take with alcohol or while driving or operating heavy  machinery.  May cause drowsiness. 08/17/22  Yes Valentino Nose, NP  fluticasone (FLONASE) 50 MCG/ACT nasal spray Place 1 spray into both nostrils daily. 08/17/22  Yes Valentino Nose, NP  Burr Medico 150-35 MCG/24HR transdermal patch 1 patch once a week. 06/07/22  Yes [provider]  bacitracin-polymyxin b (POLYSPORIN) ophthalmic ointment Place 1 application into the left eye 2 (two) times daily. apply to eye every 12 hours while awake 02/07/21   Rodriguez-Southworth, Nettie Elm, PA-C  ibuprofen (ADVIL) 600 MG tablet Take 1 tablet (600 mg total) by mouth every 6 (six) hours. 04/25/19   Marlinda Mike, CNM  POLY-IRON 150 150 MG capsule TK 1 C PO QD 03/01/19 02/07/21  [provider]    Family History History reviewed. No pertinent family history.  Social History Social History   Tobacco Use   Smoking status: Never   Smokeless tobacco: Never  Vaping Use   Vaping Use: Never used  Substance Use Topics   Alcohol use: Never   Drug use: Never     Allergies   Patient has no known allergies.   Review of Systems Review of Systems Per HPI  Physical Exam Triage Vital Signs ED Triage Vitals  Enc Vitals Group  BP 08/17/22 1057 (!) 105/49     Pulse Rate 08/17/22 1057 83     Resp 08/17/22 1057 18     Temp 08/17/22 1057 99.4 F (37.4 C)     Temp Source 08/17/22 1057 Oral     SpO2 08/17/22 1057 98 %     Weight --      Height --      Head Circumference --      Peak Flow --      Pain Score 08/17/22 1055 0     Pain Loc --      Pain Edu? --      Excl. in [place]? --    No data found.  Updated Vital Signs BP (!) 105/49 (BP Location: Right Arm)   Pulse 83   Temp 99.4 F (37.4 C) (Oral)   Resp 18   LMP 08/08/2022 (Exact Date)   SpO2 98%   Visual Acuity Right Eye Distance:   Left Eye Distance:   Bilateral Distance:    Right Eye Near:   Left Eye Near:    Bilateral Near:     Physical Exam Vitals and nursing note reviewed.  Constitutional:      General: She is  not in acute distress.    Appearance: Normal appearance. She is not ill-appearing or toxic-appearing.  HENT:     Head: Normocephalic and atraumatic.     Right Ear: Tympanic membrane, ear canal and external ear normal.     Left Ear: Tympanic membrane, ear canal and external ear normal.     Nose: No congestion or rhinorrhea.     Mouth/Throat:     Mouth: Mucous membranes are moist.     Pharynx: Oropharynx is clear. No oropharyngeal exudate or posterior oropharyngeal erythema.     Comments: Cobblestoning of posterior pharynx Eyes:     General: No scleral icterus.    Extraocular Movements: Extraocular movements intact.  Cardiovascular:     Rate and Rhythm: Normal rate and regular rhythm.  Pulmonary:     Effort: Pulmonary effort is normal. No respiratory distress.     Breath sounds: Normal breath sounds. No wheezing, rhonchi or rales.  Abdominal:     General: Abdomen is flat. Bowel sounds are normal. There is no distension.     Palpations: Abdomen is soft.     Tenderness: There is no abdominal tenderness. There is no guarding.  Musculoskeletal:     Cervical back: Normal range of motion and neck supple.  Lymphadenopathy:     Cervical: No cervical adenopathy.  Skin:    General: Skin is warm and dry.     Coloration: Skin is not jaundiced or pale.     Findings: No erythema or rash.  Neurological:     Mental Status: She is alert and oriented to person, place, and time.  Psychiatric:        Behavior: Behavior is cooperative.      UC Treatments / Results  Labs (all labs ordered are listed, but only abnormal results are displayed) Labs Reviewed - No data to display  EKG   Radiology No results found.  Procedures Procedures (including critical care time)  Medications Ordered in UC Medications - No data to display  Initial Impression / Assessment and Plan / UC Course  I have reviewed the triage vital signs and the nursing notes.  Pertinent labs & imaging results that were  available during my care of the patient were reviewed by me and considered in my medical  decision making (see chart for details).   Patient is well-appearing, afebrile, not tachycardic, not tachypneic, oxygenating well on room air.  Blood pressure is mildly decreased today, however this appears to be patient's baseline.  He denies symptoms including chest pain or dizziness.  Post-viral cough syndrome No red flags in history or on exam Chest x-ray deferred today Treat with cough suppressant ER and return precautions discussed  Post-nasal drainage Suspect this is contributing to cough Start Flonase nasal spray ER and return precautions discussed   The patient was given the opportunity to ask questions.  All questions answered to their satisfaction.  The patient is in agreement to this plan.    Final Clinical Impressions(s) / UC Diagnoses   Final diagnoses:  Post-viral cough syndrome  Post-nasal drainage     Discharge Instructions      Your symptoms are consistent with a post viral cough and post nasal drainage.   Please start the cough suppressants as needed for the dry cough.    You can also start the nasal spray to prevent drainage from going down the back of your throat.  If symptoms worsen or persist, follow up with [place] or PCP.     ED Prescriptions     Medication Sig Dispense Auth. Provider   benzonatate (TESSALON) 100 MG capsule Take 1 capsule (100 mg total) by mouth 3 (three) times daily as needed for cough. Do not take with alcohol or while driving or operating heavy machinery.  May cause drowsiness. 21 capsule Cathlean Marseilles A, NP   fluticasone (FLONASE) 50 MCG/ACT nasal spray Place 1 spray into both nostrils daily. 16 g Valentino Nose, NP      PDMP not reviewed this encounter.   Valentino Nose, NP 08/17/22 1214

## 2022-12-09 ENCOUNTER — Ambulatory Visit
Admission: EM | Admit: 2022-12-09 | Discharge: 2022-12-09 | Disposition: A | Discharge status: Home / Self Care | Payer: Medicaid Other | Attending: Physician Assistant | Admitting: Physician Assistant

## 2022-12-09 DIAGNOSIS — S21002A Unspecified open wound of left breast, initial encounter: Secondary | ICD-10-CM | POA: Insufficient documentation

## 2022-12-09 DIAGNOSIS — Z113 Encounter for screening for infections with a predominantly sexual mode of transmission: Secondary | ICD-10-CM | POA: Diagnosis not present

## 2022-12-09 MED ORDER — TRIAMCINOLONE ACETONIDE 0.1 % EX CREA: 1.0000 | TOPICAL_CREAM | Freq: Two times a day (BID) | CUTANEOUS | 0 refills | Status: DC

## 2022-12-09 MED ORDER — DOXYCYCLINE HYCLATE 100 MG PO CAPS: 100.0000 mg | ORAL_CAPSULE | Freq: Two times a day (BID) | ORAL | 0 refills | Status: DC

## 2022-12-09 NOTE — ED Provider Notes (Signed)
[facility]    CSN: [id number] Arrival date & time: 12/09/22  4196      History   Chief Complaint Chief Complaint  Patient presents with   Rash    HPI Lindsay Simmons is a 27 y.o. female.   Patient here today for evaluation of rash to her left breast that has been present for about one week/ She notes rash is itchy but not painful. She denies any fever. She does not report treatment. She requests STD screening. She denies any STD symptoms or exposures. She denies risk for pregnancy.   The history is provided by the patient.    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 Authorizing Provider  doxycycline (VIBRAMYCIN) 100 MG capsule Take 1 capsule (100 mg total) by mouth 2 (two) times daily. 12/09/22  Yes Francene Finders, PA-C  triamcinolone cream (KENALOG) 0.1 % Apply 1 Application topically 2 (two) times daily. 12/09/22  Yes Francene Finders, PA-C  bacitracin-polymyxin b (POLYSPORIN) ophthalmic ointment Place 1 application into the left eye 2 (two) times daily. apply to eye every 12 hours while awake 02/07/21   Rodriguez-Southworth, Sunday Spillers, PA-C  benzonatate (TESSALON) 100 MG capsule Take 1 capsule (100 mg total) by  mouth 3 (three) times daily as needed for cough. Do not take with alcohol or while driving or operating heavy machinery.  May cause drowsiness. 08/17/22   Eulogio Bear, NP  fluticasone (FLONASE) 50 MCG/ACT nasal spray Place 1 spray into both nostrils daily. 08/17/22   Eulogio Bear, NP  ibuprofen (ADVIL) 600 MG tablet Take 1 tablet (600 mg total) by mouth every 6 (six) hours. 04/25/19   Artelia Laroche, CNM  [name] 150-35 MCG/24HR transdermal patch 1 patch once a week. 06/07/22   [provider]  POLY-IRON 150 150 MG capsule TK 1 C PO QD 03/01/19 02/07/21  [provider]    Family History Family History  Family history unknown: Yes    Social History Social History   Tobacco Use   Smoking status: Never   Smokeless tobacco: Never  Vaping Use   Vaping Use: Never used  Substance Use Topics   Alcohol use: Never   Drug use: Never     Allergies   Patient has no known allergies.   Review of Systems Review of Systems  Constitutional:  Negative for chills and fever.  Eyes:  Negative for discharge and  redness.  Respiratory:  Negative for shortness of breath.   Gastrointestinal:  Negative for nausea and vomiting.  Genitourinary:  Negative for dysuria, genital sores and vaginal discharge.  Skin:  Positive for rash and wound. Negative for color change.     Physical Exam Triage Vital Signs ED Triage Vitals  Enc Vitals Group     BP      Pulse      Resp      Temp      Temp src      SpO2      Weight      Height      Head Circumference      Peak Flow      Pain Score      Pain Loc      Pain Edu?      Excl. in [place]?    No data found.  Updated Vital Signs BP 124/73 (BP Location: Right Arm)   Pulse 64   Temp 98.2 F (36.8 C) (Oral)   Resp 18   LMP 11/17/2022   SpO2 98%   Visual Acuity Right Eye Distance:   Left Eye Distance:   Bilateral Distance:    Right Eye Near:   Left Eye Near:    Bilateral Near:     Physical Exam Vitals and nursing  note reviewed.  Constitutional:      General: She is not in acute distress.    Appearance: Normal appearance. She is not ill-appearing.  HENT:     Head: Normocephalic and atraumatic.  Eyes:     Conjunctiva/sclera: Conjunctivae normal.  Cardiovascular:     Rate and Rhythm: Normal rate.  Pulmonary:     Effort: Pulmonary effort is normal. No respiratory distress.  Chest:       Comments: Approx 2 cm crusted wound to left inner breast without drainage, minimal surrounding erythema. Neurological:     Mental Status: She is alert.  Psychiatric:        Mood and Affect: Mood normal.        Behavior: Behavior normal.        Thought Content: Thought content normal.      UC Treatments / Results  Labs (all labs ordered are listed, but only abnormal results are displayed) Labs Reviewed  RPR  HIV ANTIBODY (ROUTINE TESTING W REFLEX)  HEPATITIS PANEL, ACUTE  CERVICOVAGINAL ANCILLARY ONLY    EKG   Radiology No results found.  Procedures Procedures (including critical care time)  Medications Ordered in UC Medications - No data to display  Initial Impression / Assessment and Plan / UC Course  I have reviewed the triage vital signs and the nursing notes.  Pertinent labs & imaging results that were available during my care of the patient were reviewed by me and considered in my medical decision making (see chart for details).    STD screening ordered as requested and will treat to cover bacterial infection with doxycycline and itching with kenalog cream. Encouraged follow up if no gradual improvement or with any further concerns.   Final Clinical Impressions(s) / UC Diagnoses   Final diagnoses:  Screening for STD (sexually transmitted disease)  Wound of left breast, initial encounter   Discharge Instructions   None    ED Prescriptions     Medication Sig Dispense Auth. Provider   doxycycline (VIBRAMYCIN) 100 MG capsule Take 1 capsule (100 mg total) by mouth 2 (two) times  daily. 20 capsule Francene Finders, PA-C   triamcinolone  cream (KENALOG) 0.1 % Apply 1 Application topically 2 (two) times daily. 30 g Francene Finders, PA-C      PDMP not reviewed this encounter.   Francene Finders, PA-C 12/09/22 [phone]

## 2022-12-09 NOTE — ED Triage Notes (Signed)
Pt presents with rash on left breast X 1 week that is itchy.

## 2022-12-10 ENCOUNTER — Telehealth (HOSPITAL_COMMUNITY): Payer: Self-pay | Admitting: Emergency Medicine

## 2022-12-10 LAB — CERVICOVAGINAL ANCILLARY ONLY
Bacterial Vaginitis (gardnerella): POSITIVE — AB
Candida Glabrata: NEGATIVE
Candida Vaginitis: NEGATIVE
Chlamydia: NEGATIVE
Comment: NEGATIVE
Comment: NEGATIVE
Comment: NEGATIVE
Comment: NEGATIVE
Comment: NEGATIVE
Comment: NORMAL
Neisseria Gonorrhea: NEGATIVE
Trichomonas: NEGATIVE

## 2022-12-10 LAB — HIV ANTIBODY (ROUTINE TESTING W REFLEX): HIV Screen 4th Generation wRfx: NONREACTIVE

## 2022-12-10 LAB — RPR: RPR Ser Ql: NONREACTIVE

## 2022-12-10 MED ORDER — METRONIDAZOLE 0.75 % VA GEL: 1.0000 | Freq: Every day | VAGINAL | 0 refills | Status: AC

## 2022-12-21 ENCOUNTER — Encounter: Payer: Self-pay | Admitting: Emergency Medicine

## 2022-12-21 ENCOUNTER — Ambulatory Visit
Admission: EM | Admit: 2022-12-21 | Discharge: 2022-12-21 | Disposition: A | Discharge status: Home / Self Care | Payer: Medicaid Other | Attending: Physician Assistant | Admitting: Physician Assistant

## 2022-12-21 DIAGNOSIS — L42 Pityriasis rosea: Secondary | ICD-10-CM | POA: Diagnosis not present

## 2022-12-21 MED ORDER — TRIAMCINOLONE ACETONIDE 0.1 % EX CREA: 1.0000 | TOPICAL_CREAM | Freq: Two times a day (BID) | CUTANEOUS | 0 refills | Status: DC

## 2022-12-21 NOTE — ED Triage Notes (Signed)
Pt presents with rash that has spread over abdomin and back. Pt states rash has gotten worse since last visit on 2/07. States kenalog cream helped, but ran out of medication.

## 2022-12-21 NOTE — ED Provider Notes (Signed)
[facility]    CSN: [id number] Arrival date & time: 12/21/22  1312      History   Chief Complaint Chief Complaint  Patient presents with   Rash    HPI Lindsay Simmons is a 27 y.o. female.   Patient here today for evaluation of continued rash that has worsened over the last several weeks. She reports rash is itchy at times. She has not had any fever or other symptoms. She has used steroid cream with minimal improvement.   The history is provided by the patient.  Rash Associated symptoms: no abdominal pain, no fever, no nausea and not vomiting     Past Medical History:  Diagnosis Date   Anemia    Heart murmur     Patient Active Problem List   Diagnosis Date Noted   SVD (spontaneous vaginal delivery) 04/23/2019   Postpartum hemorrhage 04/23/2019   Retained placenta or membranes 04/23/2019   Postpartum care following vaginal delivery (6/21) 04/23/2019   Prolonged latent phase of labor 04/22/2019   Heart murmur 01/08/2019   Supervision of normal first pregnancy, antepartum 10/19/2018    Past Surgical History:  Procedure Laterality Date   NO PAST SURGERIES      OB History     Gravida  1   Para  1   Term  1   Preterm      AB      Living  1      SAB      IAB      Ectopic      Multiple  0   Live Births  1            Home Medications    Prior to Admission medications   Medication Sig Start Date End Date Taking? Authorizing Provider  bacitracin-polymyxin b (POLYSPORIN) ophthalmic ointment Place 1 application into the left eye 2 (two) times daily. apply to eye every 12 hours while awake 02/07/21   Rodriguez-Southworth, Sunday Spillers, PA-C  benzonatate (TESSALON) 100 MG capsule Take 1 capsule (100 mg total) by mouth 3 (three) times daily as needed for cough. Do not take with alcohol or while driving or operating heavy machinery.  May cause drowsiness. 08/17/22   Eulogio Bear, NP  doxycycline (VIBRAMYCIN) 100 MG capsule Take 1 capsule  (100 mg total) by mouth 2 (two) times daily. 12/09/22   Francene Finders, PA-C  fluticasone (FLONASE) 50 MCG/ACT nasal spray Place 1 spray into both nostrils daily. 08/17/22   Eulogio Bear, NP  ibuprofen (ADVIL) 600 MG tablet Take 1 tablet (600 mg total) by mouth every 6 (six) hours. 04/25/19   Artelia Laroche, CNM  triamcinolone cream (KENALOG) 0.1 % Apply 1 Application topically 2 (two) times daily. 12/21/22   Francene Finders, PA-C  [name] 150-35 MCG/24HR transdermal patch 1 patch once a week. 06/07/22   [provider]  POLY-IRON 150 150 MG capsule TK 1 C PO QD 03/01/19 02/07/21  [provider]    Family History Family History  Family history unknown: Yes    Social History Social History   Tobacco Use   Smoking status: Never   Smokeless tobacco: Never  Vaping Use   Vaping Use: Never used  Substance Use Topics   Alcohol use: Never   Drug use: Never     Allergies   Patient has no known allergies.   Review of Systems Review of Systems  Constitutional:  Negative for chills and fever.  Eyes:  Negative  for discharge and redness.  Gastrointestinal:  Negative for abdominal pain, nausea and vomiting.  Skin:  Positive for rash.     Physical Exam Triage Vital Signs ED Triage Vitals  Enc Vitals Group     BP 12/21/22 1443 130/66     Pulse Rate 12/21/22 1443 64     Resp 12/21/22 1443 16     Temp 12/21/22 1443 97.9 F (36.6 C)     Temp Source 12/21/22 1443 Oral     SpO2 12/21/22 1443 97 %     Weight --      Height --      Head Circumference --      Peak Flow --      Pain Score 12/21/22 1442 0     Pain Loc --      Pain Edu? --      Excl. in [place]? --    No data found.  Updated Vital Signs BP 130/66 (BP Location: Right Arm)   Pulse 64   Temp 97.9 F (36.6 C) (Oral)   Resp 16   LMP 12/02/2022   SpO2 97%      Physical Exam Vitals and nursing note reviewed.  Constitutional:      General: She is not in acute distress.    Appearance: Normal  appearance. She is not ill-appearing.  HENT:     Head: Normocephalic and atraumatic.  Eyes:     Conjunctiva/sclera: Conjunctivae normal.  Cardiovascular:     Rate and Rhythm: Normal rate.  Pulmonary:     Effort: Pulmonary effort is normal.  Skin:    Findings: Rash (mildly erythematous annular maculopapular rash to abdomen and back with some lesions with central clearing) present.  Neurological:     Mental Status: She is alert.  Psychiatric:        Mood and Affect: Mood normal.        Behavior: Behavior normal.        Thought Content: Thought content normal.      UC Treatments / Results  Labs (all labs ordered are listed, but only abnormal results are displayed) Labs Reviewed - No data to display  EKG   Radiology No results found.  Procedures Procedures (including critical care time)  Medications Ordered in UC Medications - No data to display  Initial Impression / Assessment and Plan / UC Course  I have reviewed the triage vital signs and the nursing notes.  Pertinent labs & imaging results that were available during my care of the patient were reviewed by me and considered in my medical decision making (see chart for details).    Suspect likely pityriasis rosea and discussed that this is a self limiting rash. Steroid refilled to help with itching. Encouraged follow up with any further concerns.   Final Clinical Impressions(s) / UC Diagnoses   Final diagnoses:  Pityriasis rosea   Discharge Instructions   None    ED Prescriptions     Medication Sig Dispense Auth. Provider   triamcinolone cream (KENALOG) 0.1 % Apply 1 Application topically 2 (two) times daily. 30 g Francene Finders, PA-C      PDMP not reviewed this encounter.   Francene Finders, PA-C 12/21/22 1511

## 2022-12-24 DIAGNOSIS — O021 Missed abortion: Secondary | ICD-10-CM | POA: Diagnosis not present

## 2023-03-07 ENCOUNTER — Ambulatory Visit
Admission: EM | Admit: 2023-03-07 | Discharge: 2023-03-07 | Disposition: A | Discharge status: Home / Self Care | Payer: Medicaid Other | Attending: Physician Assistant | Admitting: Physician Assistant

## 2023-03-07 DIAGNOSIS — Z113 Encounter for screening for infections with a predominantly sexual mode of transmission: Secondary | ICD-10-CM

## 2023-03-07 NOTE — ED Provider Notes (Addendum)
[facility]    CSN: [id number] Arrival date & time: 03/07/23  1019      History   Chief Complaint No chief complaint on file.   HPI Lindsay Simmons is a 27 y.o. female.   Patient presents today requesting STI testing.  She has no specific concern or exposure.  Does report that she had a rash in February that was diagnosed as pityriasis rosea which is since resolved.  She had STI testing at that time that was negative and has not had any recurrent rash.  She is requesting a complete STI panel.  She denies history of STI.  She denies any symptoms including pelvic pain, abdominal pain, fever, nausea, vomiting, abnormal discharge.  She denies any recent antibiotic use in the past 90 days.  She is confident that she is not pregnant.    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 Authorizing Provider  Burr Medico 150-35 MCG/24HR transdermal patch 1 patch once a week. 06/07/22   [provider]  POLY-IRON 150 150 MG capsule TK 1 C PO QD 03/01/19 02/07/21  [provider]    Family History Family History  Family history unknown: Yes    Social History Social History   Tobacco Use   Smoking status: Never    Smokeless tobacco: Never  Vaping Use   Vaping Use: Never used  Substance Use Topics   Alcohol use: Never   Drug use: Never     Allergies   Patient has no known allergies.   Review of Systems Review of Systems  Constitutional:  Negative for activity change, appetite change, fatigue and fever.  Respiratory:  Negative for cough and shortness of breath.   Cardiovascular:  Negative for chest pain.  Gastrointestinal:  Negative for abdominal pain, diarrhea, nausea and vomiting.  Genitourinary:  Negative for dysuria, frequency, pelvic pain, urgency, vaginal bleeding, vaginal discharge and vaginal pain.  Skin:  Negative for rash.     Physical Exam Triage Vital Signs ED Triage Vitals  Enc Vitals Group     BP 03/07/23 1022 131/67     Pulse Rate 03/07/23 1022 62     Resp 03/07/23 1022 20     Temp 03/07/23 1022 98.7 F (37.1 C)     Temp Source 03/07/23 1022 Oral     SpO2 03/07/23 1022 98 %     Weight --      Height --  Head Circumference --      Peak Flow --      Pain Score 03/07/23 1028 0     Pain Loc --      Pain Edu? --      Excl. in [place]? --    No data found.  Updated Vital Signs BP 131/67 (BP Location: Right Arm)   Pulse 62   Temp 98.7 F (37.1 C) (Oral)   Resp 20   LMP 03/04/2023 (Approximate) Comment: BC patch  SpO2 98%   Visual Acuity Right Eye Distance:   Left Eye Distance:   Bilateral Distance:    Right Eye Near:   Left Eye Near:    Bilateral Near:     Physical Exam Vitals reviewed.  Constitutional:      General: She is awake. She is not in acute distress.    Appearance: Normal appearance. She is well-developed. She is not ill-appearing.     Comments: Very pleasant female appears stated age in no acute distress and comfortable in exam room  HENT:     Head: Normocephalic and atraumatic.  Cardiovascular:     Rate and Rhythm: Normal rate and regular rhythm.     Heart sounds: Normal heart sounds, S1 normal and S2 normal. No murmur  heard. Pulmonary:     Effort: Pulmonary effort is normal.     Breath sounds: Normal breath sounds. No wheezing, rhonchi or rales.     Comments: Clear to auscultation bilaterally Abdominal:     Palpations: Abdomen is soft.     Tenderness: There is no abdominal tenderness. There is no right CVA tenderness, left CVA tenderness, guarding or rebound.  Psychiatric:        Behavior: Behavior is cooperative.      UC Treatments / Results  Labs (all labs ordered are listed, but only abnormal results are displayed) Labs Reviewed  RPR  HIV ANTIBODY (ROUTINE TESTING W REFLEX)  HEPATITIS C ANTIBODY  CERVICOVAGINAL ANCILLARY ONLY    EKG   Radiology No results found.  Procedures Procedures (including critical care time)  Medications Ordered in UC Medications - No data to display  Initial Impression / Assessment and Plan / UC Course  I have reviewed the triage vital signs and the nursing notes.  Pertinent labs & imaging results that were available during my care of the patient were reviewed by me and considered in my medical decision making (see chart for details).     STI panel including gonorrhea, chlamydia, trichomonas, HIV, syphilis, hepatitis was obtained and is pending.  Patient reports that rash has resolved and she has not had any recurrent symptoms.  She does have MyChart and was encouraged to monitor her MyChart for these results.  We discussed the importance of safe sex practices.  We will contact her if we need to arrange any treatment.  Discussed that if she develops any symptoms she should return for reevaluation.  Final Clinical Impressions(s) / UC Diagnoses   Final diagnoses:  Screening examination for STI     Discharge Instructions      Monitor your MyChart for results.  We will contact you if anything is positive we will need to arrange treatment.  Use a condom with each sexual encounter.  If you develop any symptoms including abdominal pain, pelvic pain,  fever, nausea, vomiting, vaginal discharge you should be reevaluated.  Abstain from sex until you receive your results.     ED Prescriptions   None    PDMP not reviewed  this encounter.   Jeani Hawking, PA-C 03/07/23 1047    RaspetNoberto Retort, PA-C 03/07/23 1048

## 2023-03-07 NOTE — Discharge Instructions (Signed)
Monitor your MyChart for results.  We will contact you if anything is positive we will need to arrange treatment.  Use a condom with each sexual encounter.  If you develop any symptoms including abdominal pain, pelvic pain, fever, nausea, vomiting, vaginal discharge you should be reevaluated.  Abstain from sex until you receive your results.

## 2023-03-07 NOTE — ED Triage Notes (Addendum)
Pt reports she has broke out in a rash all over her body back in February and wants std testing to make sure that it is not HIV. She states she has no rash now but has hyperpigmentation and is concerned for HIV beings though she is sexually active.  Pt states she will do all STD testing

## 2023-03-09 LAB — HIV ANTIBODY (ROUTINE TESTING W REFLEX): HIV Screen 4th Generation wRfx: NONREACTIVE

## 2023-03-09 LAB — CERVICOVAGINAL ANCILLARY ONLY
Bacterial Vaginitis (gardnerella): NEGATIVE
Candida Glabrata: NEGATIVE
Candida Vaginitis: NEGATIVE
Chlamydia: NEGATIVE
Comment: NEGATIVE
Comment: NEGATIVE
Comment: NEGATIVE
Comment: NEGATIVE
Comment: NEGATIVE
Comment: NORMAL
Neisseria Gonorrhea: NEGATIVE
Trichomonas: NEGATIVE

## 2023-03-09 LAB — RPR: RPR Ser Ql: NONREACTIVE

## 2023-06-18 ENCOUNTER — Emergency Department (HOSPITAL_COMMUNITY): Payer: Medicaid Other

## 2023-06-18 ENCOUNTER — Encounter (HOSPITAL_COMMUNITY): Payer: Self-pay | Admitting: Emergency Medicine

## 2023-06-18 ENCOUNTER — Other Ambulatory Visit: Payer: Self-pay

## 2023-06-18 ENCOUNTER — Emergency Department (HOSPITAL_COMMUNITY)
Admission: EM | Admit: 2023-06-18 | Discharge: 2023-06-18 | Discharge status: Against Medical Advice | Payer: Medicaid Other | Source: Home / Self Care | Attending: Emergency Medicine | Admitting: Emergency Medicine

## 2023-06-18 ENCOUNTER — Ambulatory Visit (HOSPITAL_COMMUNITY): Admission: RE | Admit: 2023-06-18 | Payer: Medicaid Other | Source: Ambulatory Visit

## 2023-06-18 ENCOUNTER — Emergency Department (HOSPITAL_COMMUNITY)
Admission: EM | Admit: 2023-06-18 | Discharge: 2023-06-18 | Disposition: A | Discharge status: Home / Self Care | Payer: Medicaid Other | Source: Home / Self Care | Attending: Emergency Medicine | Admitting: Emergency Medicine

## 2023-06-18 ENCOUNTER — Encounter (HOSPITAL_COMMUNITY): Payer: Self-pay

## 2023-06-18 DIAGNOSIS — Z3A00 Weeks of gestation of pregnancy not specified: Secondary | ICD-10-CM | POA: Insufficient documentation

## 2023-06-18 DIAGNOSIS — O4690 Antepartum hemorrhage, unspecified, unspecified trimester: Secondary | ICD-10-CM

## 2023-06-18 DIAGNOSIS — O209 Hemorrhage in early pregnancy, unspecified: Secondary | ICD-10-CM | POA: Insufficient documentation

## 2023-06-18 DIAGNOSIS — O3680X0 Pregnancy with inconclusive fetal viability, not applicable or unspecified: Secondary | ICD-10-CM | POA: Diagnosis not present

## 2023-06-18 DIAGNOSIS — Z3A10 10 weeks gestation of pregnancy: Secondary | ICD-10-CM | POA: Insufficient documentation

## 2023-06-18 DIAGNOSIS — Z3A01 Less than 8 weeks gestation of pregnancy: Secondary | ICD-10-CM | POA: Diagnosis not present

## 2023-06-18 LAB — COMPREHENSIVE METABOLIC PANEL
ALT: 22 U/L (ref 0–44)
AST: 27 U/L (ref 15–41)
Albumin: 4 g/dL (ref 3.5–5.0)
Alkaline Phosphatase: 51 U/L (ref 38–126)
Anion gap: 8 (ref 5–15)
BUN: <5 mg/dL — ABNORMAL LOW (ref 6–20)
CO2: 21 mmol/L — ABNORMAL LOW (ref 22–32)
Calcium: 8.9 mg/dL (ref 8.9–10.3)
Chloride: 105 mmol/L (ref 98–111)
Creatinine, Ser: 0.55 mg/dL (ref 0.44–1.00)
GFR, Estimated: >60 mL/min (ref >60)
Glucose, Bld: 112 mg/dL — ABNORMAL HIGH (ref 70–99)
Potassium: 3.6 mmol/L (ref 3.5–5.1)
Sodium: 134 mmol/L — ABNORMAL LOW (ref 135–145)
Total Bilirubin: 0.6 mg/dL (ref 0.3–1.2)
Total Protein: 7.5 g/dL (ref 6.5–8.1)

## 2023-06-18 LAB — URINALYSIS, ROUTINE W REFLEX MICROSCOPIC
Bilirubin Urine: NEGATIVE
Glucose, UA: NEGATIVE mg/dL
Ketones, ur: 5 mg/dL — AB
Leukocytes,Ua: NEGATIVE
Nitrite: NEGATIVE
Protein, ur: NEGATIVE mg/dL
RBC / HPF: >50 RBC/hpf (ref 0–5)
Specific Gravity, Urine: 1.004 — ABNORMAL LOW (ref 1.005–1.030)
pH: 5 (ref 5.0–8.0)

## 2023-06-18 LAB — WET PREP, GENITAL
Clue Cells Wet Prep HPF POC: NONE SEEN
Sperm: NONE SEEN
Trich, Wet Prep: NONE SEEN
WBC, Wet Prep HPF POC: >=10 — AB (ref <10)
Yeast Wet Prep HPF POC: NONE SEEN

## 2023-06-18 LAB — CBC WITH DIFFERENTIAL/PLATELET
Abs Immature Granulocytes: 0.02 10*3/uL (ref 0.00–0.07)
Basophils Absolute: 0 10*3/uL (ref 0.0–0.1)
Basophils Relative: 1 %
Eosinophils Absolute: 0 10*3/uL (ref 0.0–0.5)
Eosinophils Relative: 0 %
HCT: 32.7 % — ABNORMAL LOW (ref 36.0–46.0)
Hemoglobin: 10.5 g/dL — ABNORMAL LOW (ref 12.0–15.0)
Immature Granulocytes: 0 %
Lymphocytes Relative: 20 %
Lymphs Abs: 1.7 10*3/uL (ref 0.7–4.0)
MCH: 23.5 pg — ABNORMAL LOW (ref 26.0–34.0)
MCHC: 32.1 g/dL (ref 30.0–36.0)
MCV: 73.3 fL — ABNORMAL LOW (ref 80.0–100.0)
Monocytes Absolute: 0.5 10*3/uL (ref 0.1–1.0)
Monocytes Relative: 6 %
Neutro Abs: 5.8 10*3/uL (ref 1.7–7.7)
Neutrophils Relative %: 73 %
Platelets: 270 10*3/uL (ref 150–400)
RBC: 4.46 MIL/uL (ref 3.87–5.11)
RDW: 16.3 % — ABNORMAL HIGH (ref 11.5–15.5)
WBC: 8.1 10*3/uL (ref 4.0–10.5)
nRBC: 0 % (ref 0.0–0.2)

## 2023-06-18 LAB — ABO/RH: ABO/RH(D): O POS

## 2023-06-18 LAB — PREGNANCY, URINE: Preg Test, Ur: POSITIVE — AB

## 2023-06-18 LAB — HCG, QUANTITATIVE, PREGNANCY: hCG, Beta Chain, Quant, S: 5344 m[IU]/mL — ABNORMAL HIGH (ref <5)

## 2023-06-18 NOTE — ED Triage Notes (Signed)
The patient was seen earlier but had to leave before care was completed. She presents now to continue care.

## 2023-06-18 NOTE — Discharge Instructions (Addendum)
Unfortunately it looks like you are having a miscarriage based on your ultrasound results.  However there is a small chance that you have an abnormal pregnancy and the tubes going to your ovary.  Due to this is very important to go get repeat blood work at the maternity admission unit at [facility] at 8 AM on Sunday, 8/18.  However, if before then you develop abdominal pain, dizziness, or any other new/concerning symptoms then return to the ER or call 911.

## 2023-06-18 NOTE — ED Notes (Signed)
Called LAB to add urine sample.

## 2023-06-18 NOTE — ED Notes (Addendum)
Patient refused a final set of vitals. Patient was educated by the MD on the importance of continuing care, but patient still decided to leave in order to care for her child. She did agree to come back.

## 2023-06-18 NOTE — ED Triage Notes (Signed)
Patient presents to ER with vaginal bleeding. Patient states she is pregnant, started bleeding last Friday and states it has not stopped. Ambulatory to triage A&Ox4.

## 2023-06-18 NOTE — Consult Note (Signed)
   OB/GYN Telephone Consult  Lindsay Simmons is a 27 y.o. G1P1001 presenting with vaginal bleeding in the setting of known pregnancy.    I was called for a consult regarding the care of this patient by [facility].    The provider had a clinical question regarding management following [place] with NO IUP/EUP and HCG 5344.   The provider presented the following relevant clinical information and I performed a chart review on the patient and reviewed available documentation:  The patient is a 12/1009 with LMP in July who presented to the [facility] with vaginal bleeding that started last Friday.  She does not have abdominal pain.   I reviewed her pelvic ultrasound images independently and my findings are consistent with the radiologist read of her TVUS. No IUP or EUP.  She does have a small amount of fluid that is hypoechoic in the right adnexa but does not have the appearance of blood. No other free fluid on the [place].   I reviewed CareEverywhere: She has established with [facility]. She had a missed abortion in February 2024.   BP 127/64   Pulse 65   Temp 98.4 F (36.9 C) (Oral)   Resp 18   SpO2 100%   Exam- performed by consulting provider   Recommendations:  - Close follow up with in the MAU for beta HCG. Scheduled for Sunday at 8/18 at 8am. ER, Dr. Criss Alvine to inform pt.    Thank you for this consult and if additional recommendations are needed please call [phone] for the OB/GYN attending on service at [facility].   I spent approximately 5 minutes directly consulting with the provider and verbally discussing this case. Additionally 10 minutes minutes was spent performing chart review and documentation.   Milas Hock, MD Attending Obstetrician & Gynecologist, [facility] for [facility], [facility]

## 2023-06-18 NOTE — ED Provider Notes (Signed)
EMERGENCY DEPARTMENT AT [facility] Provider Note   CSN: [id number] Arrival date & time: 06/18/23  1106     History  Chief Complaint  Patient presents with   Vaginal Bleeding    Lindsay Simmons is a 27 y.o. female G2P1001 history of anemia present today for evaluation of vaginal bleeding.  Patient reports that she has had vaginal bleeding in the last 7 days.  Patient states that she soaked through 3-5 pads a day.  Patient thinks that she is [redacted] weeks pregnant based on her LMP.  She got a positive home pregnancy test in the beginning of July.  She denies any fever, nausea, vomiting, abdominal pain, abnormal vaginal discharge, urinary symptoms.   Vaginal Bleeding   Past Medical History:  Diagnosis Date   Anemia    Heart murmur    Past Surgical History:  Procedure Laterality Date   NO PAST SURGERIES       Home Medications Prior to Admission medications   Medication Sig Start Date End Date Taking? Authorizing Provider  Burr Medico 150-35 MCG/24HR transdermal patch 1 patch once a week. 06/07/22   [provider]  POLY-IRON 150 150 MG capsule TK 1 C PO QD 03/01/19 02/07/21  [provider]      Allergies    Patient has no known allergies.    Review of Systems   Review of Systems  Genitourinary:  Positive for vaginal bleeding.    Physical Exam Updated Vital Signs BP (!) 148/76 (BP Location: Left Arm)   Pulse 78   Temp 98 F (36.7 C) (Oral)   Resp 18   Ht 5\' 4"  (1.626 m)   Wt 59 kg   SpO2 98%   BMI 22.31 kg/m  Physical Exam Vitals and nursing note reviewed.  Constitutional:      Appearance: Normal appearance.  HENT:     Head: Normocephalic and atraumatic.     Mouth/Throat:     Mouth: Mucous membranes are moist.  Eyes:     General: No scleral icterus. Cardiovascular:     Rate and Rhythm: Normal rate and regular rhythm.     Pulses: Normal pulses.     Heart sounds: Normal heart sounds.  Pulmonary:     Effort: Pulmonary effort is  normal.     Breath sounds: Normal breath sounds.  Abdominal:     General: Abdomen is flat.     Palpations: Abdomen is soft.     Tenderness: There is no abdominal tenderness.  Genitourinary:    Comments: Pelvic exam done with Paramedics present throughout the examination. Blood clots noted in the vagina. Difficult to visualize cervix due to blood clots. Musculoskeletal:        General: No deformity.  Skin:    General: Skin is warm.     Findings: No rash.  Neurological:     General: No focal deficit present.     Mental Status: She is alert.  Psychiatric:        Mood and Affect: Mood normal.     ED Results / Procedures / Treatments   Labs (all labs ordered are listed, but only abnormal results are displayed) Labs Reviewed  WET PREP, GENITAL - Abnormal; Notable for the following components:      Result Value   WBC, Wet Prep HPF POC >=10 (*)    All other components within normal limits  HCG, QUANTITATIVE, PREGNANCY - Abnormal; Notable for the following components:   hCG, Beta Chain, Quant, S 5,344 (*)  All other components within normal limits  CBC WITH DIFFERENTIAL/PLATELET - Abnormal; Notable for the following components:   Hemoglobin 10.5 (*)    HCT 32.7 (*)    MCV 73.3 (*)    MCH 23.5 (*)    RDW 16.3 (*)    All other components within normal limits  COMPREHENSIVE METABOLIC PANEL - Abnormal; Notable for the following components:   Sodium 134 (*)    CO2 21 (*)    Glucose, Bld 112 (*)    BUN <5 (*)    All other components within normal limits  URINALYSIS, ROUTINE W REFLEX MICROSCOPIC - Abnormal; Notable for the following components:   Color, Urine STRAW (*)    Specific Gravity, Urine 1.004 (*)    Hgb urine dipstick LARGE (*)    Ketones, ur 5 (*)    Bacteria, UA RARE (*)    All other components within normal limits  PREGNANCY, URINE - Abnormal; Notable for the following components:   Preg Test, Ur POSITIVE (*)    All other components within normal limits  ABO/RH   GC/CHLAMYDIA PROBE AMP ([place]) NOT AT [facility]    EKG None  Radiology No results found.  Procedures Procedures    Medications Ordered in ED Medications - No data to display  ED Course/ Medical Decision Making/ A&P                                 Medical Decision Making Amount and/or Complexity of Data Reviewed Labs: ordered. Radiology: ordered.   This patient presents to the ED for vaginal bleeding in pregnancy, this involves an extensive number of treatment options, and is a complaint that carries with a high risk of complications and morbidity.  The differential diagnosis includes ectopic pregnancy, abortion, trauma.  This is not an exhaustive list.  Lab tests: I ordered and personally interpreted labs.  The pertinent results include: WBC unremarkable. Hbg 10.5. Platelets unremarkable. Electrolytes unremarkable. BUN, creatinine unremarkable. Hcg 5344  Imaging studies: I ordered imaging studies, personally reviewed, interpreted imaging and agree with the radiologist's interpretations. The results include: US pelvic and transabdominal ordered and pending.  Problem list/ ED course/ Critical interventions/ Medical management: HPI: See above Vital signs within normal range and stable throughout visit. Laboratory/imaging studies significant for: See above. On physical examination, patient is afebrile and appears in no acute distress. This pregnant patient presents with vaginal bleeding in the first trimester. Pelvic exam done with Paramedics present throughout the examination. Blood clots noted in the vagina. Difficult to visualize cervix due to blood clots. No obvious product of contraception noted. Differential includes ectopic, IUP, threatened/inevitable abortion, along with completed abortion. Patient is Rho + so Rho gam is not indicated.  Transvaginal ultrasound ordered.  However, patient states that she needs to go pick up her son and will return to the ER for the  ultrasound. Patient is overall hemodynamically stable at this point. She agrees to sign AMA form. I have reviewed the patient home medicines and have made adjustments as needed.  Cardiac monitoring/EKG: The patient was maintained on a cardiac monitor.  I personally reviewed and interpreted the cardiac monitor which showed an underlying rhythm of: sinus rhythm.  Additional history obtained: External records from outside source obtained and reviewed including: Chart review including previous notes, labs, imaging.  Consultations obtained:  Disposition Patient leaves AMA.  This chart was dictated using voice recognition software.  Despite best efforts  to proofread,  errors can occur which can change the documentation meaning.          Final Clinical Impression(s) / ED Diagnoses Final diagnoses:  Vaginal bleeding in pregnancy    Rx / DC Orders ED Discharge Orders     None         Jeanelle Malling, [place] 06/18/23 1551    Laurence Spates, MD 06/18/23 [phone]

## 2023-06-18 NOTE — ED Provider Notes (Signed)
[place] EMERGENCY DEPARTMENT AT [facility] Provider Note   CSN: [id number] Arrival date & time: 06/18/23  1527     History  Chief Complaint  Patient presents with   Vaginal Bleeding    Meghen Treichel is a 27 y.o. female.  HPI 27 year old female who is G2, P1 presents with vaginal bleeding.  She was here earlier in the day and was recommended to get an ultrasound but she had to pick up her son.  She is now back for the ultrasound.  In brief, she had some bleeding last month when she found out she was pregnant and then it went away.  The bleeding has come back over the last week or so.  Seems to be slowing down a little bit.  She had some cramping earlier in the week but no cramping today.  She was at least partially concern for STI and was requesting testing which was performed on the previous visit.  No dysuria, vomiting, fever or other illness.  Home Medications Prior to Admission medications   Medication Sig Start Date End Date Taking? Authorizing Provider  Burr Medico 150-35 MCG/24HR transdermal patch 1 patch once a week. 06/07/22   [provider]  POLY-IRON 150 150 MG capsule TK 1 C PO QD 03/01/19 02/07/21  [provider]      Allergies    Patient has no known allergies.    Review of Systems   Review of Systems  Gastrointestinal:  Negative for abdominal pain.  Genitourinary:  Positive for vaginal bleeding.    Physical Exam Updated Vital Signs BP 127/64   Pulse 65   Temp 98.4 F (36.9 C) (Oral)   Resp 18   SpO2 100%  Physical Exam Vitals and nursing note reviewed.  Constitutional:      Appearance: She is well-developed.  HENT:     Head: Normocephalic and atraumatic.  Cardiovascular:     Rate and Rhythm: Normal rate and regular rhythm.     Heart sounds: Normal heart sounds.  Pulmonary:     Effort: Pulmonary effort is normal.  Abdominal:     General: There is no distension.     Palpations: Abdomen is soft.     Tenderness: There is no  abdominal tenderness.  Skin:    General: Skin is warm and dry.  Neurological:     Mental Status: She is alert.     ED Results / Procedures / Treatments   Labs (all labs ordered are listed, but only abnormal results are displayed) Labs Reviewed - No data to display  EKG None  Radiology US OB Transvaginal  Result Date: 06/18/2023 CLINICAL DATA:  Pregnant patient with bleeding, quantitative HCG of 5,344 EXAM: OBSTETRIC <14 WK [place] AND TRANSVAGINAL OB US TECHNIQUE: Both transabdominal and transvaginal ultrasound examinations were performed for complete evaluation of the gestation as well as the maternal uterus, adnexal regions, and pelvic cul-de-sac. Transvaginal technique was performed to assess early pregnancy. COMPARISON:  None Available. FINDINGS: Intrauterine gestational sac: None Yolk sac:  Not Visualized. Embryo:  Not Visualized. Maternal uterus/adnexae: Right ovary measures 3.3 x 1.8 by 2.2 cm. The left ovary measures 2.8 by 3.6 x 2.6 cm and contains corpus luteum. Small free fluid in the cul-de-sac. Small volume hypoechoic fluid in the right adnexa but without discrete adnexal mass seen. IMPRESSION: 1. No IUP identified. Small volume hypoechoic fluid in the right adnexa but without definitive right adnexal mass. Primary differential includes occult ectopic pregnancy and recent failed pregnancy. Though no  discrete right adnexal mass is seen, there is some non simple fluid at the right adnexa which is slightly concerning. Close clinical monitoring and serial HCG advised with repeat ultrasound as indicated. Electronically Signed   By: Jasmine Pang M.D.   On: 06/18/2023 17:12   US OB Comp < 14 Wks  Result Date: 06/18/2023 CLINICAL DATA:  Pregnant patient with bleeding, quantitative HCG of 5,344 EXAM: OBSTETRIC <14 WK [place] AND TRANSVAGINAL OB US TECHNIQUE: Both transabdominal and transvaginal ultrasound examinations were performed for complete evaluation of the gestation as well as the maternal  uterus, adnexal regions, and pelvic cul-de-sac. Transvaginal technique was performed to assess early pregnancy. COMPARISON:  None Available. FINDINGS: Intrauterine gestational sac: None Yolk sac:  Not Visualized. Embryo:  Not Visualized. Maternal uterus/adnexae: Right ovary measures 3.3 x 1.8 by 2.2 cm. The left ovary measures 2.8 by 3.6 x 2.6 cm and contains corpus luteum. Small free fluid in the cul-de-sac. Small volume hypoechoic fluid in the right adnexa but without discrete adnexal mass seen. IMPRESSION: 1. No IUP identified. Small volume hypoechoic fluid in the right adnexa but without definitive right adnexal mass. Primary differential includes occult ectopic pregnancy and recent failed pregnancy. Though no discrete right adnexal mass is seen, there is some non simple fluid at the right adnexa which is slightly concerning. Close clinical monitoring and serial HCG advised with repeat ultrasound as indicated. Electronically Signed   By: Jasmine Pang M.D.   On: 06/18/2023 17:12    Procedures Procedures    Medications Ordered in ED Medications - No data to display  ED Course/ Medical Decision Making/ A&P                                 Medical Decision Making Amount and/or Complexity of Data Reviewed Radiology: ordered and independent interpretation performed.    Details: No IUP.   Patient is well-appearing, has normal vitals and no abdominal tenderness.  Ultrasound obtained and shows no IUP with questionable right ovary findings.  I discussed all this with OB/GYN on-call, Dr. Para March.  She feels this is unlikely to be ectopic and will recommend a 48-hour recheck at [facility] and has scheduled her for 8 AM on 8/18.  Patient is okay with this plan.  We discussed return precautions.  I also discussed STI options with patient. She would like to wait on GC/Chlamydia testing prior to treatment, as she doesn't have high concern at this moment.        Final Clinical Impression(s) / ED  Diagnoses Final diagnoses:  Vaginal bleeding in pregnancy    Rx / DC Orders ED Discharge Orders     None         Pricilla Loveless, MD 06/18/23 [phone]

## 2023-06-20 ENCOUNTER — Inpatient Hospital Stay (HOSPITAL_COMMUNITY): Admit: 2023-06-20 | Payer: Medicaid Other

## 2023-06-21 LAB — GC/CHLAMYDIA PROBE AMP (~~LOC~~) NOT AT ARMC
Chlamydia: NEGATIVE
Comment: NEGATIVE
Comment: NORMAL
Neisseria Gonorrhea: NEGATIVE

## 2023-08-07 ENCOUNTER — Ambulatory Visit
Admission: EM | Admit: 2023-08-07 | Discharge: 2023-08-07 | Disposition: A | Discharge status: Home / Self Care | Payer: Medicaid Other | Attending: Family Medicine | Admitting: Family Medicine

## 2023-08-07 ENCOUNTER — Other Ambulatory Visit: Payer: Self-pay

## 2023-08-07 ENCOUNTER — Encounter: Payer: Self-pay | Admitting: Emergency Medicine

## 2023-08-07 DIAGNOSIS — Z113 Encounter for screening for infections with a predominantly sexual mode of transmission: Secondary | ICD-10-CM

## 2023-08-07 NOTE — ED Provider Notes (Signed)
[facility]    CSN: [id number] Arrival date & time: 08/07/23  1045      History   Chief Complaint Chief Complaint  Patient presents with   SEXUALLY TRANSMITTED DISEASE    HPI Lindsay Simmons is a 27 y.o. female.   Patient presenting today requesting STD screening.  She denies any new exposures or symptoms at this time, just wanting to get routine screening for safety.  She states she is currently on her period so does not want a vaginal swab done today but wants blood work for HIV and syphilis.  Denies any past history of positives for either.    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 Authorizing Provider  Burr Medico 150-35 MCG/24HR transdermal patch 1 patch once a week. 06/07/22   [provider]  POLY-IRON 150 150 MG capsule TK 1 C PO QD 03/01/19 02/07/21  [provider]    Family History Family History  Family history unknown: Yes    Social History Social History   Tobacco Use   Smoking status: Never   Smokeless tobacco: Never  Vaping Use   Vaping status: Never Used  Substance Use Topics   Alcohol use: Never   Drug use: Never     Allergies   Patient has no known  allergies.   Review of Systems Review of Systems Per HPI  Physical Exam Triage Vital Signs ED Triage Vitals  Encounter Vitals Group     BP 08/07/23 1132 114/72     Systolic BP Percentile --      Diastolic BP Percentile --      Pulse Rate 08/07/23 1132 60     Resp 08/07/23 1132 20     Temp 08/07/23 1132 98.7 F (37.1 C)     Temp Source 08/07/23 1132 Oral     SpO2 08/07/23 1132 98 %     Weight --      Height --      Head Circumference --      Peak Flow --      Pain Score 08/07/23 1131 0     Pain Loc --      Pain Education --      Exclude from Growth Chart --    No data found.  Updated Vital Signs BP 114/72 (BP Location: Right Arm)   Pulse 60   Temp 98.7 F (37.1 C) (Oral)  Resp 20   LMP 08/06/2023 (Approximate)   SpO2 98%   Breastfeeding No   Visual Acuity Right Eye Distance:   Left Eye Distance:   Bilateral Distance:    Right Eye Near:   Left Eye Near:    Bilateral Near:     Physical Exam Vitals and nursing note reviewed.  Constitutional:      Appearance: Normal appearance. She is not ill-appearing.  HENT:     Head: Atraumatic.  Eyes:     Extraocular Movements: Extraocular movements intact.     Conjunctiva/sclera: Conjunctivae normal.  Cardiovascular:     Rate and Rhythm: Normal rate and regular rhythm.     Heart sounds: Normal heart sounds.  Pulmonary:     Effort: Pulmonary effort is normal.     Breath sounds: Normal breath sounds.  Genitourinary:    Comments: GU exam declined Musculoskeletal:        General: Normal range of motion.     Cervical back: Normal range of motion and neck supple.  Skin:    General: Skin is warm and dry.  Neurological:     Mental Status: She is alert and oriented to person, place, and time.  Psychiatric:        Mood and Affect: Mood normal.        Thought Content: Thought content normal.        Judgment: Judgment normal.      UC Treatments / Results  Labs (all labs ordered are listed, but only abnormal  results are displayed) Labs Reviewed  HIV ANTIBODY (ROUTINE TESTING W REFLEX)  RPR    EKG   Radiology No results found.  Procedures Procedures (including critical care time)  Medications Ordered in UC Medications - No data to display  Initial Impression / Assessment and Plan / UC Course  I have reviewed the triage vital signs and the nursing notes.  Pertinent labs & imaging results that were available during my care of the patient were reviewed by me and considered in my medical decision making (see chart for details).     HIV and syphilis labs pending, treat based on results.  Safe sexual practices reviewed. Final Clinical Impressions(s) / UC Diagnoses   Final diagnoses:  Screening examination for STI   Discharge Instructions   None    ED Prescriptions   None    PDMP not reviewed this encounter.   Particia Nearing, [place] 08/07/23 1148

## 2023-08-07 NOTE — ED Triage Notes (Signed)
Pt reports would like to be checked for STD. Pt reports blood work only as is currently on period. Pt specifically inquiring about HIV,Syphilis, hep B. Denies any symptoms or pain.

## 2023-08-08 LAB — HIV ANTIBODY (ROUTINE TESTING W REFLEX): HIV Screen 4th Generation wRfx: NONREACTIVE

## 2023-08-08 LAB — RPR: RPR Ser Ql: NONREACTIVE

## 2023-09-09 DIAGNOSIS — Z30016 Encounter for initial prescription of transdermal patch hormonal contraceptive device: Secondary | ICD-10-CM | POA: Diagnosis not present

## 2023-11-03 ENCOUNTER — Encounter: Payer: Self-pay | Admitting: Emergency Medicine

## 2023-11-03 ENCOUNTER — Ambulatory Visit
Admission: EM | Admit: 2023-11-03 | Discharge: 2023-11-03 | Disposition: A | Discharge status: Home / Self Care | Payer: Medicaid Other | Attending: Family Medicine | Admitting: Family Medicine

## 2023-11-03 DIAGNOSIS — Z113 Encounter for screening for infections with a predominantly sexual mode of transmission: Secondary | ICD-10-CM | POA: Diagnosis not present

## 2023-11-03 NOTE — ED Triage Notes (Signed)
Wants STD testing.  Denies symptoms.

## 2023-11-03 NOTE — ED Provider Notes (Signed)
 [facility]    CSN: [id number] Arrival date & time: 11/03/23  1441      History   Chief Complaint No chief complaint on file.   HPI Lindsay Simmons is a 28 y.o. female.   Patient presenting today requesting STD screening.  Denies any exposures or symptoms at this time.    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 Authorizing Provider  XULANE 150-35 MCG/24HR transdermal patch 1 patch once a week. 06/07/22   [provider]  POLY-IRON  150 150 MG capsule TK 1 C PO QD 03/01/19 02/07/21  [provider]    Family History Family History  Family history unknown: Yes    Social History Social History   Tobacco Use   Smoking status: Never   Smokeless tobacco: Never  Vaping Use   Vaping status: Never Used  Substance Use Topics   Alcohol use: Never   Drug use: Never     Allergies   Patient has no known allergies.   Review of Systems Review of Systems Per HPI  Physical Exam Triage Vital Signs ED Triage Vitals  Encounter Vitals Group     BP 11/03/23 1453 113/60     Systolic BP Percentile --      Diastolic BP Percentile --      Pulse Rate 11/03/23 1453 (!) 54     Resp 11/03/23  1453 18     Temp 11/03/23 1453 98.6 F (37 C)     Temp Source 11/03/23 1453 Oral     SpO2 11/03/23 1453 98 %     Weight --      Height --      Head Circumference --      Peak Flow --      Pain Score 11/03/23 1454 0     Pain Loc --      Pain Education --      Exclude from Growth Chart --    No data found.  Updated Vital Signs BP 113/60 (BP Location: Right Arm)   Pulse (!) 54   Temp 98.6 F (37 C) (Oral)   Resp 18   LMP 10/31/2023 (Exact Date)   SpO2 98%   Visual Acuity Right Eye Distance:   Left Eye Distance:   Bilateral Distance:    Right Eye Near:   Left Eye Near:    Bilateral Near:     Physical Exam  Vitals and nursing note reviewed.  Constitutional:      Appearance: Normal appearance. She is not ill-appearing.  HENT:     Head: Atraumatic.  Eyes:     Extraocular Movements: Extraocular movements intact.     Conjunctiva/sclera: Conjunctivae normal.  Cardiovascular:     Rate and Rhythm: Normal rate and regular rhythm.     Heart sounds: Normal heart sounds.  Pulmonary:     Effort: Pulmonary effort is normal.     Breath sounds: Normal breath sounds.  Genitourinary:    Comments: GU exam deferred, self swab performed Musculoskeletal:        General: Normal range of motion.     Cervical back: Normal range of motion and neck supple.  Skin:    General: Skin is warm and dry.  Neurological:     Mental Status: She is alert and oriented to person, place, and time.  Psychiatric:        Mood and Affect: Mood normal.        Thought Content: Thought content normal.        Judgment: Judgment normal.      UC Treatments / Results  Labs (all labs ordered are listed, but only abnormal results are displayed) Labs Reviewed  RPR  HIV ANTIBODY (ROUTINE TESTING W REFLEX)  CERVICOVAGINAL ANCILLARY ONLY    EKG   Radiology No results found.  Procedures Procedures (including critical care time)  Medications Ordered in UC Medications - No data to  display  Initial Impression / Assessment and Plan / UC Course  I have reviewed the triage vital signs and the nursing notes.  Pertinent labs & imaging results that were available during my care of the patient were reviewed by me and considered in my medical decision making (see chart for details).     Vaginal swab, HIV and syphilis labs all pending.  Treat based on results.  Safe sexual practices reviewed.  Final Clinical Impressions(s) / UC Diagnoses   Final diagnoses:  Screening examination for STI   Discharge Instructions   None    ED Prescriptions   None    PDMP not reviewed this encounter.   Stuart Vernell Norris, [place] 11/03/23 1614

## 2023-11-04 ENCOUNTER — Telehealth (HOSPITAL_COMMUNITY): Payer: Self-pay | Admitting: Emergency Medicine

## 2023-11-04 LAB — CERVICOVAGINAL ANCILLARY ONLY
Bacterial Vaginitis (gardnerella): NEGATIVE
Candida Glabrata: NEGATIVE
Candida Vaginitis: POSITIVE — AB
Chlamydia: NEGATIVE
Comment: NEGATIVE
Comment: NEGATIVE
Comment: NEGATIVE
Comment: NEGATIVE
Comment: NEGATIVE
Comment: NORMAL
Neisseria Gonorrhea: NEGATIVE
Trichomonas: NEGATIVE

## 2023-11-04 MED ORDER — FLUCONAZOLE 150 MG PO TABS: 150.0000 mg | ORAL_TABLET | Freq: Once | ORAL | 0 refills | Status: AC

## 2023-11-04 NOTE — Telephone Encounter (Signed)
 Diflucan for positive yeast

## 2023-11-05 ENCOUNTER — Ambulatory Visit: Payer: Medicaid Other

## 2023-11-07 LAB — RPR: RPR Ser Ql: NONREACTIVE

## 2023-11-07 LAB — HIV ANTIBODY (ROUTINE TESTING W REFLEX): HIV Screen 4th Generation wRfx: NONREACTIVE

## 2024-02-29 ENCOUNTER — Ambulatory Visit

## 2024-03-05 ENCOUNTER — Ambulatory Visit
Admission: EM | Admit: 2024-03-05 | Discharge: 2024-03-05 | Disposition: A | Discharge status: Home / Self Care | Attending: Family Medicine | Admitting: Family Medicine

## 2024-03-05 DIAGNOSIS — Z113 Encounter for screening for infections with a predominantly sexual mode of transmission: Secondary | ICD-10-CM

## 2024-03-05 NOTE — ED Provider Notes (Signed)
 [facility]    CSN: [id number] Arrival date & time: 03/05/24  9811      History   Chief Complaint No chief complaint on file.   HPI Lindsay Simmons is a 28 y.o. female.   Patient presenting today requesting STD screening.  She denies any symptoms or recent exposures.    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 Authorizing Provider  XULANE 150-35 MCG/24HR transdermal patch 1 patch once a week. 06/07/22   [provider]  POLY-IRON  150 150 MG capsule TK 1 C PO QD 03/01/19 02/07/21  [provider]    Family History Family History  Family history unknown: Yes    Social History Social History   Tobacco Use   Smoking status: Never   Smokeless tobacco: Never  Vaping Use   Vaping status: Never Used  Substance Use Topics   Alcohol use: Never   Drug use: Never     Allergies   Gluten meal and Tilactase   Review of Systems Review of Systems PER HPI  Physical Exam Triage Vital Signs ED Triage Vitals  Encounter Vitals Group     BP 03/05/24 0919 105/70     Systolic BP Percentile --      Diastolic BP Percentile --      Pulse Rate 03/05/24 0919 66     Resp 03/05/24 0919 18      Temp 03/05/24 0919 98.8 F (37.1 C)     Temp Source 03/05/24 0919 Oral     SpO2 03/05/24 0919 98 %     Weight --      Height --      Head Circumference --      Peak Flow --      Pain Score 03/05/24 0922 0     Pain Loc --      Pain Education --      Exclude from Growth Chart --    No data found.  Updated Vital Signs BP 105/70 (BP Location: Right Arm)   Pulse 66   Temp 98.8 F (37.1 C) (Oral)   Resp 18   LMP 02/14/2024 (Exact Date)   SpO2 98%   Visual Acuity Right Eye Distance:   Left Eye Distance:   Bilateral Distance:    Right Eye Near:   Left Eye Near:    Bilateral Near:     Physical Exam Vitals and nursing note  reviewed.  Constitutional:      Appearance: Normal appearance. She is not ill-appearing.  HENT:     Head: Atraumatic.  Eyes:     Extraocular Movements: Extraocular movements intact.     Conjunctiva/sclera: Conjunctivae normal.  Cardiovascular:     Rate and Rhythm: Normal rate.  Pulmonary:     Effort: Pulmonary effort is normal.  Genitourinary:    Comments: GU exam deferred, self swab performed Musculoskeletal:        General: Normal range of motion.     Cervical back: Normal range of motion and neck supple.  Skin:    General: Skin is warm and dry.  Neurological:     Mental Status: She is alert and oriented to person, place, and time.  Psychiatric:        Mood and Affect: Mood normal.        Thought Content: Thought content normal.        Judgment: Judgment normal.      UC Treatments / Results  Labs (all labs ordered are listed, but only abnormal results are displayed) Labs Reviewed  HIV ANTIBODY (ROUTINE TESTING W REFLEX)  RPR  CERVICOVAGINAL ANCILLARY ONLY    EKG   Radiology No results found.  Procedures Procedures (including critical care time)  Medications Ordered in UC Medications - No data to display  Initial Impression / Assessment and Plan / UC Course  I have reviewed the triage vital signs and the nursing  notes.  Pertinent labs & imaging results that were available during my care of the patient were reviewed by me and considered in my medical decision making (see chart for details).     HIV, syphilis and vaginal swab testing all pending.  Treat based on results.  Return for worsening symptoms.  Final Clinical Impressions(s) / UC Diagnoses   Final diagnoses:  Screening examination for STI   Discharge Instructions   None    ED Prescriptions   None    PDMP not reviewed this encounter.   Tacy Expose [place], [place] 03/05/24 [phone]

## 2024-03-05 NOTE — ED Triage Notes (Signed)
 Pt reports wanting routine STD panel and blood work.

## 2024-03-07 LAB — CERVICOVAGINAL ANCILLARY ONLY
Chlamydia: NEGATIVE
Comment: NEGATIVE
Comment: NEGATIVE
Comment: NORMAL
Neisseria Gonorrhea: NEGATIVE
Trichomonas: NEGATIVE

## 2024-03-08 LAB — HIV ANTIBODY (ROUTINE TESTING W REFLEX): HIV Screen 4th Generation wRfx: NONREACTIVE

## 2024-03-08 LAB — RPR: RPR Ser Ql: NONREACTIVE

## 2024-07-13 ENCOUNTER — Ambulatory Visit
Admission: EM | Admit: 2024-07-13 | Discharge: 2024-07-13 | Disposition: A | Discharge status: Home / Self Care | Attending: Emergency Medicine | Admitting: Emergency Medicine

## 2024-07-13 ENCOUNTER — Other Ambulatory Visit: Payer: Self-pay

## 2024-07-13 DIAGNOSIS — Z113 Encounter for screening for infections with a predominantly sexual mode of transmission: Secondary | ICD-10-CM | POA: Diagnosis not present

## 2024-07-13 LAB — POCT URINE PREGNANCY: Preg Test, Ur: NEGATIVE

## 2024-07-13 LAB — POCT URINE DIPSTICK
Bilirubin, UA: NEGATIVE
Blood, UA: NEGATIVE
Glucose, UA: NEGATIVE mg/dL
Ketones, POC UA: NEGATIVE mg/dL
Leukocytes, UA: NEGATIVE
Nitrite, UA: NEGATIVE
Spec Grav, UA: >=1.03 — AB (ref 1.010–1.025)
Urobilinogen, UA: 0.2 U/dL
pH, UA: 5.5 (ref 5.0–8.0)

## 2024-07-13 NOTE — ED Triage Notes (Addendum)
 Pt c/o dysuria started today. Pt wants STI testing including blood. Pt denies sx

## 2024-07-13 NOTE — ED Provider Notes (Signed)
 [facility]    CSN: [id number] Arrival date & time: 07/13/24  1652      History   Chief Complaint Chief Complaint  Patient presents with   Dysuria    HPI Lindsay Simmons is a 28 y.o. female.  Noticed dysuria today No hematuria, abdominal pain or flank pain No fever, nausea or vomiting  She would like STD testing with blood work today Not having vaginal discharge, odor, itching  Past Medical History:  Diagnosis Date   Anemia    Heart murmur     Patient Active Problem List   Diagnosis Date Noted   SVD (spontaneous vaginal delivery) 04/23/2019   Postpartum hemorrhage 04/23/2019   Retained placenta or membranes 04/23/2019   Postpartum care following vaginal delivery (6/21) 04/23/2019   Prolonged latent phase of labor 04/22/2019   Heart murmur 01/08/2019   Supervision of normal first pregnancy, antepartum 10/19/2018    Past Surgical History:  Procedure Laterality Date   NO PAST SURGERIES      OB History     Gravida  1   Para  1   Term  1   Preterm      AB      Living  1      SAB      IAB      Ectopic      Multiple  0   Live Births  1            Home Medications    Prior to Admission medications   Medication Sig Start Date End Date Taking? Authorizing Provider  XULANE 150-35 MCG/24HR transdermal patch 1 patch once a week. 06/07/22   [provider]  POLY-IRON  150 150 MG capsule TK 1 C PO QD 03/01/19 02/07/21  [provider]    Family History Family History  Family history unknown: Yes    Social History Social History   Tobacco Use   Smoking status: Never   Smokeless tobacco: Never  Vaping Use   Vaping status: Never Used  Substance Use Topics   Alcohol use: Never   Drug use: Never     Allergies   Gluten meal and Tilactase   Review of Systems Review of Systems As per HPI  Physical Exam Triage Vital Signs ED Triage Vitals  Encounter Vitals Group     BP 07/13/24 1726 114/72     Girls  Systolic BP Percentile --      Girls Diastolic BP Percentile --      Boys Systolic BP Percentile --      Boys Diastolic BP Percentile --      Pulse Rate 07/13/24 1726 (!) 56     Resp 07/13/24 1726 16     Temp 07/13/24 1726 98.1 F (36.7 C)     Temp Source 07/13/24 1726 Oral     SpO2 07/13/24 1726 98 %     Weight --      Height --      Head Circumference --      Peak Flow --      Pain Score 07/13/24 1725 0     Pain Loc --      Pain Education --      Exclude from Growth Chart --    No data found.  Updated Vital Signs BP 114/72   Pulse (!) 56   Temp 98.1 F (36.7 C) (Oral)   Resp 16   LMP 07/03/2024   SpO2 98%    Physical  Exam Vitals and nursing note reviewed.  Constitutional:      General: She is not in acute distress. HENT:     Mouth/Throat:     Mouth: Mucous membranes are moist.     Pharynx: Oropharynx is clear.  Eyes:     Conjunctiva/sclera: Conjunctivae normal.     Pupils: Pupils are equal, round, and reactive to light.  Cardiovascular:     Rate and Rhythm: Normal rate and regular rhythm.     Heart sounds: Normal heart sounds.  Pulmonary:     Effort: Pulmonary effort is normal.     Breath sounds: Normal breath sounds.  Abdominal:     Palpations: Abdomen is soft.     Tenderness: There is no abdominal tenderness. There is no right CVA tenderness, left CVA tenderness or guarding.  Neurological:     Mental Status: She is alert and oriented to person, place, and time.     UC Treatments / Results  Labs (all labs ordered are listed, but only abnormal results are displayed) Labs Reviewed  POCT URINE DIPSTICK - Abnormal; Notable for the following components:      Result Value   Clarity, UA cloudy (*)    Spec Grav, UA >=1.030 (*)    POC PROTEIN,UA trace (*)    All other components within normal limits  RPR  HIV ANTIBODY (ROUTINE TESTING W REFLEX)  POCT URINE PREGNANCY  CERVICOVAGINAL ANCILLARY ONLY    EKG   Radiology No results  found.  Procedures Procedures (including critical care time)  Medications Ordered in UC Medications - No data to display  Initial Impression / Assessment and Plan / UC Course  I have reviewed the triage vital signs and the nursing notes.  Pertinent labs & imaging results that were available during my care of the patient were reviewed by me and considered in my medical decision making (see chart for details).  UA elevated spec grav.  Advise increasing fluids.  Otherwise no sign of urinary tract infection Cytology swab is pending along with HIV and RPR.  Treat positive result as indicated. Patient is agreeable to plan, no questions at this time  Final Clinical Impressions(s) / UC Diagnoses   Final diagnoses:  Screen for STD (sexually transmitted disease)     Discharge Instructions      We will call you if anything on your swab or blood work returns positive. You can also see these results on MyChart. Please abstain from sexual intercourse until your results return.  There is no infection in your urine, but you are dehydrated! Drink lots of water to flush your system thorough     ED Prescriptions   None    PDMP not reviewed this encounter.   Willmer Fellers, [place], PA-C 07/13/24 1759

## 2024-07-13 NOTE — Discharge Instructions (Addendum)
 We will call you if anything on your swab or blood work returns positive. You can also see these results on MyChart. Please abstain from sexual intercourse until your results return.  There is no infection in your urine, but you are dehydrated! Drink lots of water to flush your system thorough

## 2024-07-14 LAB — CERVICOVAGINAL ANCILLARY ONLY
Chlamydia: NEGATIVE
Comment: NEGATIVE
Comment: NEGATIVE
Comment: NORMAL
Neisseria Gonorrhea: NEGATIVE
Trichomonas: NEGATIVE

## 2024-07-14 LAB — HIV ANTIBODY (ROUTINE TESTING W REFLEX): HIV Screen 4th Generation wRfx: NONREACTIVE

## 2024-07-14 LAB — RPR: RPR Ser Ql: NONREACTIVE

## 2024-09-05 DIAGNOSIS — Z309 Encounter for contraceptive management, unspecified: Secondary | ICD-10-CM | POA: Diagnosis not present

## 2024-09-12 ENCOUNTER — Emergency Department (HOSPITAL_COMMUNITY): Admission: EM | Admit: 2024-09-12 | Discharge: 2024-09-12 | Discharge status: Against Medical Advice

## 2024-09-12 NOTE — ED Notes (Signed)
 Unable to find. No answer x3. No longer present. Not visualized by this RN.

## 2024-10-22 ENCOUNTER — Ambulatory Visit
Admission: EM | Admit: 2024-10-22 | Discharge: 2024-10-22 | Disposition: A | Discharge status: Home / Self Care | Attending: Family Medicine | Admitting: Family Medicine

## 2024-10-22 ENCOUNTER — Encounter: Payer: Self-pay | Admitting: Emergency Medicine

## 2024-10-22 DIAGNOSIS — Z113 Encounter for screening for infections with a predominantly sexual mode of transmission: Secondary | ICD-10-CM | POA: Insufficient documentation

## 2024-10-22 NOTE — ED Triage Notes (Signed)
 Wants to be tested for STDs and have blood work done.

## 2024-10-22 NOTE — ED Provider Notes (Signed)
 " [facility]    CSN: [id number] Arrival date & time: 10/22/24  1445      History   Chief Complaint No chief complaint on file.   HPI Lindsay Simmons is a 28 y.o. female.   Patient presenting today requesting STD screening including labs.  Denies any symptoms or new exposures.    Past Medical History:  Diagnosis Date   Anemia    Heart murmur     Patient Active Problem List   Diagnosis Date Noted   SVD (spontaneous vaginal delivery) 04/23/2019   Postpartum hemorrhage 04/23/2019   Retained placenta or membranes 04/23/2019   Postpartum care following vaginal delivery (6/21) 04/23/2019   Prolonged latent phase of labor 04/22/2019   Heart murmur 01/08/2019   Supervision of normal first pregnancy, antepartum 10/19/2018    Past Surgical History:  Procedure Laterality Date   NO PAST SURGERIES      OB History     Gravida  1   Para  1   Term  1   Preterm      AB      Living  1      SAB      IAB      Ectopic      Multiple  0   Live Births  1            Home Medications    Prior to Admission medications  Medication Sig Start Date End Date Taking? Authorizing Provider  XULANE 150-35 MCG/24HR transdermal patch 1 patch once a week. 06/07/22   [provider]  POLY-IRON  150 150 MG capsule TK 1 C PO QD 03/01/19 02/07/21  [provider]    Family History Family History  Family history unknown: Yes    Social History Social History[1]   Allergies   Gluten meal and Tilactase   Review of Systems Review of Systems Per HPI  Physical Exam Triage Vital Signs ED Triage Vitals  Encounter Vitals Group     BP 10/22/24 1516 126/81     Girls Systolic BP Percentile --      Girls Diastolic BP Percentile --      Boys Systolic BP Percentile --      Boys Diastolic BP Percentile --      Pulse Rate 10/22/24 1516 (!) 59     Resp 10/22/24 1516 16     Temp 10/22/24 1516 98 F (36.7 C)     Temp Source 10/22/24 1516 Oral      SpO2 10/22/24 1516 98 %     Weight --      Height --      Head Circumference --      Peak Flow --      Pain Score 10/22/24 1517 0     Pain Loc --      Pain Education --      Exclude from Growth Chart --    No data found.  Updated Vital Signs BP 126/81 (BP Location: Right Arm)   Pulse (!) 59   Temp 98 F (36.7 C) (Oral)   Resp 16   LMP 09/26/2024 (Exact Date)   SpO2 98%   Visual Acuity Right Eye Distance:   Left Eye Distance:   Bilateral Distance:    Right Eye Near:   Left Eye Near:    Bilateral Near:     Physical Exam Vitals and nursing note reviewed.  Constitutional:      Appearance: Normal appearance. She is not  ill-appearing.  HENT:     Head: Atraumatic.  Eyes:     Extraocular Movements: Extraocular movements intact.     Conjunctiva/sclera: Conjunctivae normal.  Cardiovascular:     Rate and Rhythm: Normal rate.  Pulmonary:     Effort: Pulmonary effort is normal.  Genitourinary:    Comments: GU exam deferred, self swab performed Musculoskeletal:        General: Normal range of motion.     Cervical back: Normal range of motion and neck supple.  Skin:    General: Skin is warm and dry.  Neurological:     Mental Status: She is alert and oriented to person, place, and time.  Psychiatric:        Mood and Affect: Mood normal.        Thought Content: Thought content normal.        Judgment: Judgment normal.      UC Treatments / Results  Labs (all labs ordered are listed, but only abnormal results are displayed) Labs Reviewed  HIV ANTIBODY (ROUTINE TESTING W REFLEX)  SYPHILIS: RPR W/REFLEX TO RPR TITER AND TREPONEMAL ANTIBODIES, TRADITIONAL SCREENING AND DIAGNOSIS ALGORITHM  CERVICOVAGINAL ANCILLARY ONLY    EKG   Radiology No results found.  Procedures Procedures (including critical care time)  Medications Ordered in UC Medications - No data to display  Initial Impression / Assessment and Plan / UC Course  I have reviewed the triage vital  signs and the nursing notes.  Pertinent labs & imaging results that were available during my care of the patient were reviewed by me and considered in my medical decision making (see chart for details).     Exam reassuring today, HIV, syphilis and vaginal swab all pending.  Will treat based on results.  Return for worsening or unresolving symptoms.  Final Clinical Impressions(s) / UC Diagnoses   Final diagnoses:  Screening examination for STI     Discharge Instructions      We will let you know if anything comes back abnormal on your STD screening.    ED Prescriptions   None    PDMP not reviewed this encounter.    [1]  Social History Tobacco Use   Smoking status: Never   Smokeless tobacco: Never  Vaping Use   Vaping status: Never Used  Substance Use Topics   Alcohol use: Never   Drug use: Never     Stuart Vernell Norris, PA-C 10/22/24 1543  "

## 2024-10-22 NOTE — Discharge Instructions (Signed)
 We will let you know if anything comes back abnormal on your STD screening.

## 2024-10-23 LAB — CERVICOVAGINAL ANCILLARY ONLY
Chlamydia: NEGATIVE
Comment: NEGATIVE
Comment: NEGATIVE
Comment: NORMAL
Neisseria Gonorrhea: NEGATIVE
Trichomonas: NEGATIVE

## 2024-10-23 LAB — SYPHILIS: RPR W/REFLEX TO RPR TITER AND TREPONEMAL ANTIBODIES, TRADITIONAL SCREENING AND DIAGNOSIS ALGORITHM: RPR Ser Ql: NONREACTIVE

## 2024-10-23 LAB — HIV ANTIBODY (ROUTINE TESTING W REFLEX): HIV Screen 4th Generation wRfx: NONREACTIVE
